# Patient Record
Sex: Female | Born: 1945 | Race: Black or African American | Hispanic: No | Marital: Married | Smoking: Never smoker
Health system: Southern US, Community
[De-identification: ages and names within clinical notes are randomized; demographics above are authoritative.]

## PROBLEM LIST (undated history)

## (undated) DIAGNOSIS — Z9289 Personal history of other medical treatment: Secondary | ICD-10-CM

## (undated) DIAGNOSIS — R413 Other amnesia: Secondary | ICD-10-CM

## (undated) DIAGNOSIS — M8580 Other specified disorders of bone density and structure, unspecified site: Secondary | ICD-10-CM

## (undated) DIAGNOSIS — I10 Essential (primary) hypertension: Secondary | ICD-10-CM

## (undated) DIAGNOSIS — R011 Cardiac murmur, unspecified: Secondary | ICD-10-CM

## (undated) DIAGNOSIS — C801 Malignant (primary) neoplasm, unspecified: Secondary | ICD-10-CM

## (undated) DIAGNOSIS — E039 Hypothyroidism, unspecified: Secondary | ICD-10-CM

## (undated) DIAGNOSIS — F0390 Unspecified dementia without behavioral disturbance: Secondary | ICD-10-CM

## (undated) HISTORY — DX: Cardiac murmur, unspecified: R01.1

## (undated) HISTORY — PX: CARPAL TUNNEL RELEASE: SHX101

## (undated) HISTORY — PX: FOOT FRACTURE SURGERY: SHX645

## (undated) HISTORY — DX: Malignant (primary) neoplasm, unspecified: C80.1

## (undated) HISTORY — DX: Other amnesia: R41.3

## (undated) HISTORY — DX: Hypothyroidism, unspecified: E03.9

## (undated) HISTORY — PX: MYOMECTOMY: SHX85

## (undated) HISTORY — DX: Other specified disorders of bone density and structure, unspecified site: M85.80

## (undated) HISTORY — DX: Personal history of other medical treatment: Z92.89

## (undated) HISTORY — DX: Unspecified dementia, unspecified severity, without behavioral disturbance, psychotic disturbance, mood disturbance, and anxiety: F03.90

## (undated) HISTORY — DX: Essential (primary) hypertension: I10

## (undated) HISTORY — PX: BUNIONECTOMY: SHX129

---

## 1998-02-06 ENCOUNTER — Encounter: Admission: RE | Admit: 1998-02-06 | Discharge: 1998-05-07 | Payer: Self-pay | Admitting: Internal Medicine

## 1998-03-10 ENCOUNTER — Encounter: Admission: RE | Admit: 1998-03-10 | Discharge: 1998-06-08 | Payer: Self-pay | Admitting: *Deleted

## 1998-10-16 DIAGNOSIS — C801 Malignant (primary) neoplasm, unspecified: Secondary | ICD-10-CM

## 1998-10-16 HISTORY — DX: Malignant (primary) neoplasm, unspecified: C80.1

## 1998-11-04 ENCOUNTER — Encounter (INDEPENDENT_AMBULATORY_CARE_PROVIDER_SITE_OTHER): Payer: Self-pay | Admitting: Specialist

## 1998-11-04 ENCOUNTER — Other Ambulatory Visit: Admission: RE | Admit: 1998-11-04 | Discharge: 1998-11-04 | Payer: Self-pay | Admitting: General Surgery

## 1998-12-09 ENCOUNTER — Ambulatory Visit (HOSPITAL_BASED_OUTPATIENT_CLINIC_OR_DEPARTMENT_OTHER): Admission: RE | Admit: 1998-12-09 | Discharge: 1998-12-09 | Payer: Self-pay | Admitting: General Surgery

## 1998-12-09 ENCOUNTER — Encounter: Payer: Self-pay | Admitting: General Surgery

## 1998-12-12 ENCOUNTER — Encounter: Admission: RE | Admit: 1998-12-12 | Discharge: 1999-03-12 | Payer: Self-pay | Admitting: *Deleted

## 1999-12-22 ENCOUNTER — Encounter: Payer: Self-pay | Admitting: Family Medicine

## 1999-12-22 ENCOUNTER — Ambulatory Visit (HOSPITAL_COMMUNITY): Admission: RE | Admit: 1999-12-22 | Discharge: 1999-12-22 | Payer: Self-pay | Admitting: Family Medicine

## 2000-03-11 ENCOUNTER — Encounter (INDEPENDENT_AMBULATORY_CARE_PROVIDER_SITE_OTHER): Payer: Self-pay

## 2000-03-11 ENCOUNTER — Ambulatory Visit (HOSPITAL_COMMUNITY): Admission: RE | Admit: 2000-03-11 | Discharge: 2000-03-11 | Payer: Self-pay | Admitting: Obstetrics and Gynecology

## 2001-11-28 ENCOUNTER — Ambulatory Visit (HOSPITAL_COMMUNITY): Admission: RE | Admit: 2001-11-28 | Discharge: 2001-11-28 | Payer: Self-pay | Admitting: Family Medicine

## 2001-11-28 ENCOUNTER — Encounter: Payer: Self-pay | Admitting: Family Medicine

## 2002-02-01 ENCOUNTER — Encounter (INDEPENDENT_AMBULATORY_CARE_PROVIDER_SITE_OTHER): Payer: Self-pay

## 2002-02-01 ENCOUNTER — Ambulatory Visit (HOSPITAL_COMMUNITY): Admission: RE | Admit: 2002-02-01 | Discharge: 2002-02-01 | Payer: Self-pay | Admitting: Obstetrics and Gynecology

## 2002-07-25 ENCOUNTER — Inpatient Hospital Stay (HOSPITAL_COMMUNITY): Admission: AC | Admit: 2002-07-25 | Discharge: 2002-07-28 | Payer: Self-pay

## 2002-07-25 ENCOUNTER — Encounter: Payer: Self-pay | Admitting: Emergency Medicine

## 2002-07-25 ENCOUNTER — Encounter: Payer: Self-pay | Admitting: Orthopedic Surgery

## 2002-09-05 ENCOUNTER — Observation Stay (HOSPITAL_COMMUNITY): Admission: RE | Admit: 2002-09-05 | Discharge: 2002-09-05 | Payer: Self-pay | Admitting: Orthopedic Surgery

## 2003-06-17 ENCOUNTER — Ambulatory Visit (HOSPITAL_COMMUNITY): Admission: RE | Admit: 2003-06-17 | Discharge: 2003-06-17 | Payer: Self-pay | Admitting: Gastroenterology

## 2004-01-06 ENCOUNTER — Other Ambulatory Visit: Admission: RE | Admit: 2004-01-06 | Discharge: 2004-01-06 | Payer: Self-pay | Admitting: Family Medicine

## 2005-01-25 ENCOUNTER — Other Ambulatory Visit: Admission: RE | Admit: 2005-01-25 | Discharge: 2005-01-25 | Payer: Self-pay | Admitting: Family Medicine

## 2005-02-03 ENCOUNTER — Encounter (HOSPITAL_COMMUNITY): Admission: RE | Admit: 2005-02-03 | Discharge: 2005-05-04 | Payer: Self-pay | Admitting: Family Medicine

## 2005-03-18 ENCOUNTER — Ambulatory Visit (HOSPITAL_COMMUNITY): Admission: RE | Admit: 2005-03-18 | Discharge: 2005-03-18 | Payer: Self-pay | Admitting: Endocrinology

## 2006-06-23 ENCOUNTER — Other Ambulatory Visit: Admission: RE | Admit: 2006-06-23 | Discharge: 2006-06-23 | Payer: Self-pay | Admitting: Family Medicine

## 2006-09-08 ENCOUNTER — Encounter: Admission: RE | Admit: 2006-09-08 | Discharge: 2006-09-08 | Payer: Self-pay | Admitting: Orthopedic Surgery

## 2008-12-16 ENCOUNTER — Other Ambulatory Visit: Admission: RE | Admit: 2008-12-16 | Discharge: 2008-12-16 | Payer: Self-pay | Admitting: Family Medicine

## 2008-12-23 ENCOUNTER — Encounter: Admission: RE | Admit: 2008-12-23 | Discharge: 2008-12-23 | Payer: Self-pay | Admitting: Family Medicine

## 2009-09-26 ENCOUNTER — Ambulatory Visit: Payer: Self-pay | Admitting: Psychology

## 2009-11-21 ENCOUNTER — Ambulatory Visit: Payer: Self-pay | Admitting: Psychology

## 2010-01-12 ENCOUNTER — Encounter: Admission: RE | Admit: 2010-01-12 | Discharge: 2010-01-12 | Payer: Self-pay | Admitting: Diagnostic Neuroimaging

## 2010-05-31 IMAGING — CR DG CHEST 2V
2 series · 2 of 2 positions shown · non-contrast
Comparison: None available

CLINICAL DATA: Weight loss, history of breast cancer

CHEST - 2 VIEW

[view not recorded (1 of 2)]
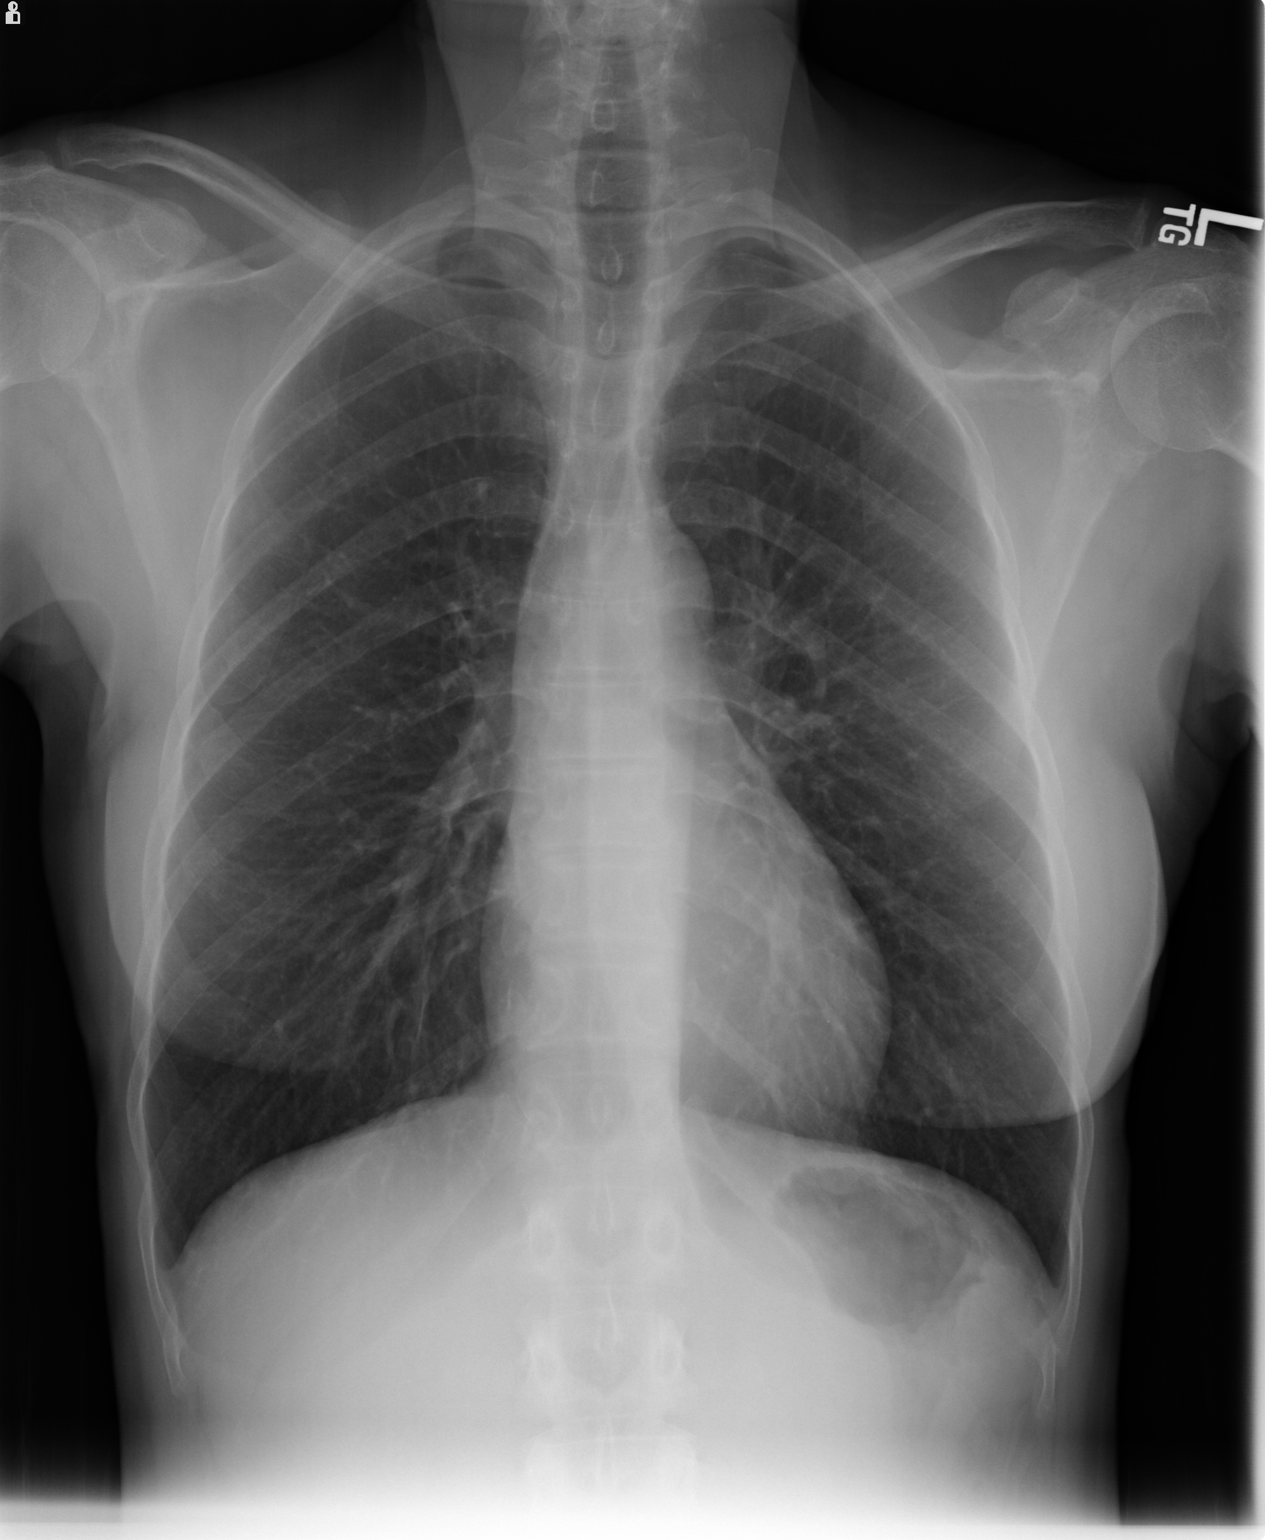

[view not recorded (2 of 2)]
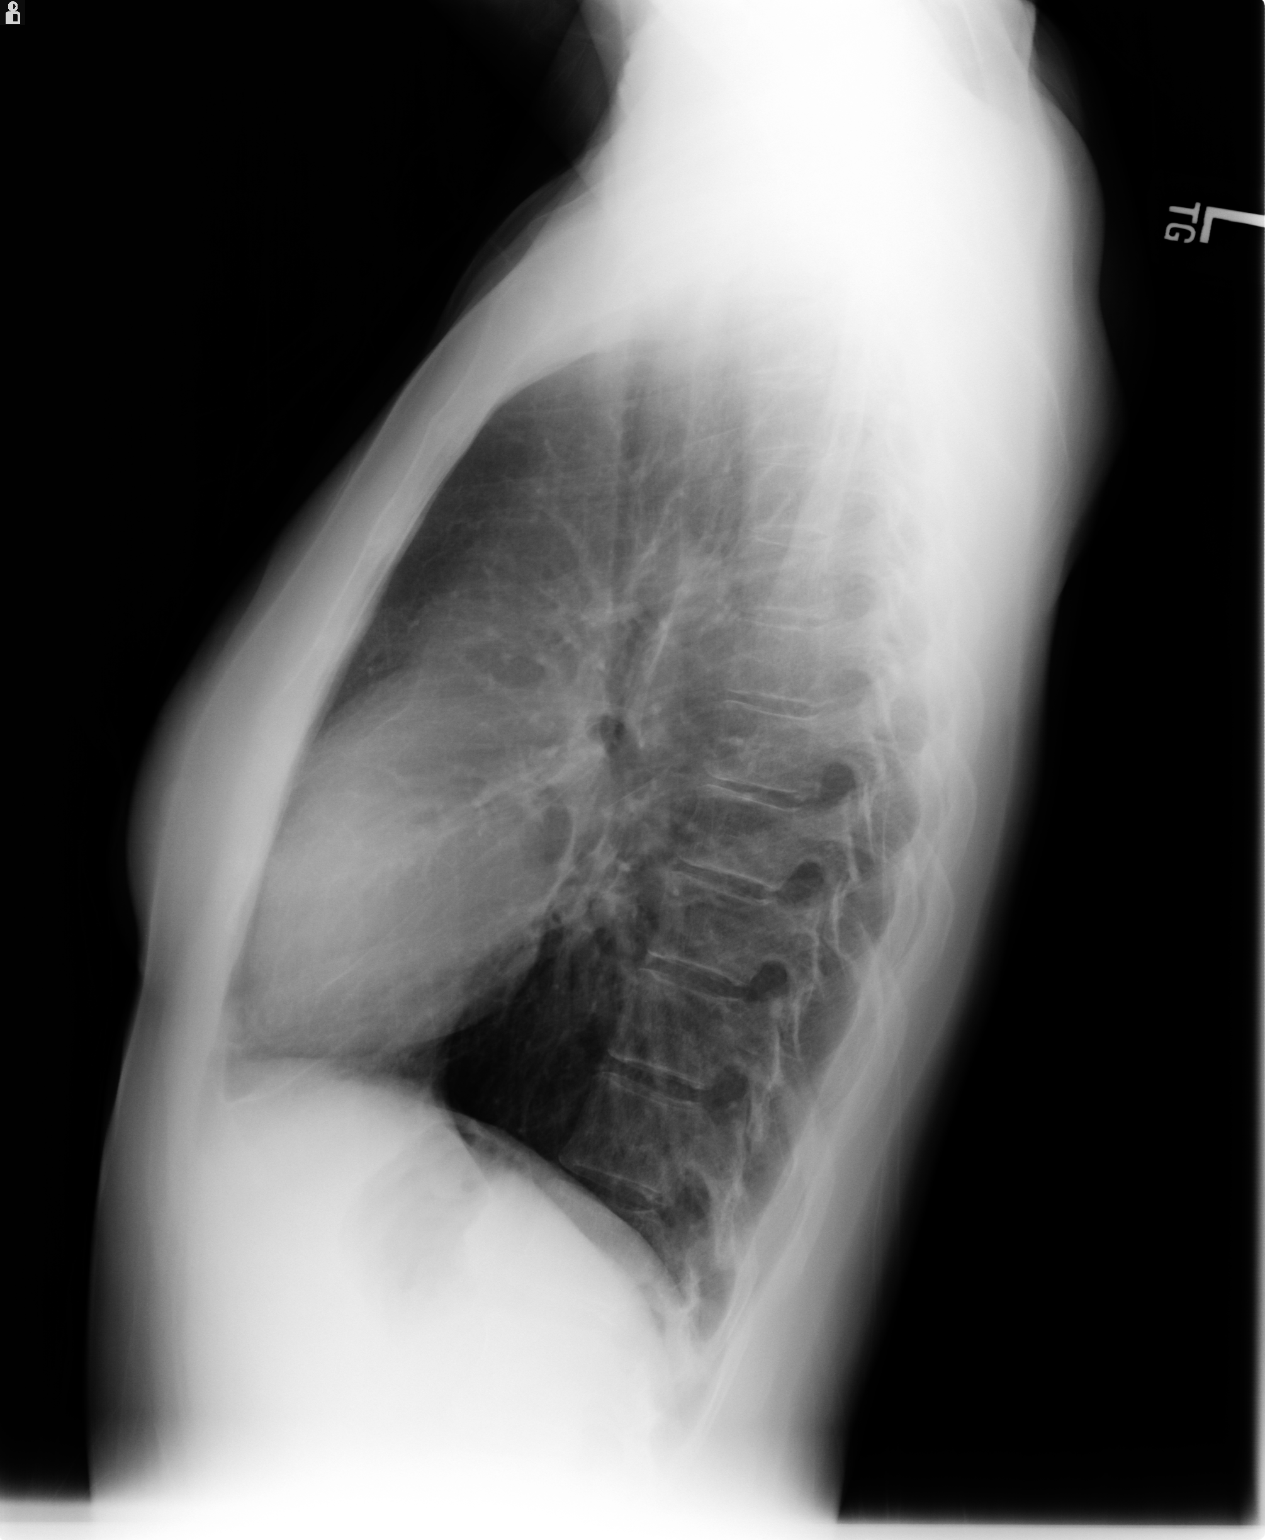

[2 of 2 positions shown; findings below may reference images not displayed]

FINDINGS: Normal mediastinum and cardiac silhouette.  Costophrenic
angles are clear.  No evidence effusion, infiltrate, or
pneumothorax. No significant bony abnormality noted.
IMPRESSION: No acute cardiopulmonary findings.
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
## 2010-10-02 NOTE — Op Note (Signed)
NAME:  Beverly Edwards, Beverly Edwards                         ACCOUNT NO.:  [id number]   MEDICAL RECORD NO.:  [id number]                   PATIENT TYPE:  INP   LOCATION:  1824                                 FACILITY:  [facility]   PHYSICIAN:  Nadara Mustard, M.D.                DATE OF BIRTH:  1945/12/03   DATE OF PROCEDURE:  07/25/2002  DATE OF DISCHARGE:                                 OPERATIVE REPORT   PREOPERATIVE DIAGNOSES:  1. Open right calcaneous fracture, type 3-B.  2. Closed right navicular fracture.  3. Closed Lisfranc fracture.   POSTOPERATIVE DIAGNOSES:  1. Open right calcaneous fracture, type 3-B.  2. Closed right navicular fracture.  3. Closed Lisfranc fracture.   PROCEDURE:  1. Irrigation and debridement with 6 liters of normal saline pulse lavage     with Dial soap.  2. Removal of ischemic bone, removal of nonviable bone and soft tissue.  3. Application of an external fixation.  4. Closure of the traumatic laceration of the skin and soft tissue, a 10-cm     laceration complicated closure.   SURGEON:  Nadara Mustard, M.D.   ANESTHESIA:  General.   ESTIMATED BLOOD LOSS:  Minimal.   ANTIBIOTICS:  1 gm of Kefzol and 60 mg of gentamicin.   DRAINS:  None.   COMPLICATIONS:  None.   TOURNIQUET TIME:  None.   DISPOSITION:  To the PACU in stable condition.   INDICATIONS FOR PROCEDURE:  The patient is a 65 year old woman with an open  calcaneous fracture, closed Lisfranc fracture and closed navicular fracture  who comes at this time for surgical intervention. The risks and benefits  were discussed. The patient states she understands the risks and wishes to  proceed at this time.   DESCRIPTION OF PROCEDURE:  The patient was brought  to operating room 1 and  underwent a general anesthetic. After an adequate level of anesthesia was  obtained the patient's right lower extremity was prepped using Betadine  paint and draped as a sterile field.   The large dramatic wound was  opened and the wound was irrigated with pulse  lavage with both normal saline and with normal saline with Dial soap. This  was cleansed. There was no gross contamination. There were very large  fragments of the calcaneous which were completely devitalized without soft  tissue attachment and these were all removed. Hemostasis was achieved. A  tourniquet was not used.   After cleansing of the wound, it appeared that the wound edges were easy to  reapproximate. The wound was closed using a far-near-near-far stitch with 2-  0 nylon and this was loosely approximated with no tension on the skin. Three  calcaneal pins were then placed through the calcaneous and this was then  constructed into a delta frame construct to pull the calcaneous out to  length. I anticipate that the patient will need bone grafting and  will need  a subtalar fusion with bone graft.   After the application of an external fixator a sterile dressing was applied  with Adaptic orthopedic sponges, 4 x 4 sponges, Kerlix and a loosely wrapped  Ace wrap. The patient's foot was viable.   The patient was extubated and taken to the PACU in stable condition. Plan  for reevaluation in 24  hours.                                               Nadara Mustard, M.D.    MVD/MEDQ  D:  07/25/2002  T:  07/25/2002  Job:  [id number]

## 2010-10-02 NOTE — H&P (Signed)
NAME:  Beverly Edwards, Beverly Edwards NO.:  [id number]   MEDICAL RECORD NO.:  [id number]                   PATIENT TYPE:  AMB   LOCATION:  SDC                                  FACILITY:  [facility]   PHYSICIAN:  Juluis Mire, M.D.                DATE OF BIRTH:  10/09/1945   DATE OF ADMISSION:  02/01/2002  DATE OF DISCHARGE:                                HISTORY & PHYSICAL   HISTORY OF PRESENT ILLNESS:  The patient is a 65 year old gravida 2, para 2  married black female who presents for a laparoscopy along with hysteroscopy  for evaluation of cystic enlargement of the ovary.   In relation to the present admission, the patient had had a previous  lumpectomy for breast carcinoma.  She has been on Tamoxifen.  Because of  being on Tamoxifen she had a screening ultrasound ordered by her family  doctor.  This did reveal a 2 cm cystic enlargement of the right ovary.  There was an apparent soft tissue mass that was felt to possibly represent a  blood clot.  She was referred back to our office for evaluation.  On  ultrasound evaluation she continued cystic enlargement of the right adnexa  measuring approximately 2 cm and did have an internal solid component.  She  was also noted to have multiple fibroids, all of which have been stable on  follow-up.  In view of the continued cystic enlargement of the right adnexa  and a history of breast cancer, the patient has now been admitted for  laparoscopic removal of right tube and ovary.  Because of being on Tamoxifen  and its potential endometrial changes, we are going to proceed with  hysteroscopy at the same time.   ALLERGIES:  CODEINE, ASPIRIN, AMOXICILLIN.   MEDICATIONS:  Tamoxifen and calcium.   PAST MEDICAL HISTORY:  Significant for history of breast carcinoma for which  she is on Tamoxifen.  Otherwise, usual childhood disease without sequelae.   PAST SURGICAL HISTORY:  She has had bunionectomy, release of right carpal  tunnel, surgery on the left hand, as well as the previously noted  lumpectomy.  She also had a hysteroscopy done in 2001.   OBSTETRICAL HISTORY:  Two vaginal deliveries.   FAMILY HISTORY:  Noncontributory.   SOCIAL HISTORY:  No tobacco or alcohol use.   REVIEW OF SYMPTOMS:  Noncontributory.   PHYSICAL EXAMINATION:  VITAL SIGNS:  The patient is afebrile with stable  vital signs.  HEENT:  The patient is normocephalic.  Pupils are equal, round, and reactive  to light and accommodation.  Extraocular movements are intact.  Sclerae and  conjunctivae clear.  Oropharynx clear.  NECK:  Without thyromegaly.  BREASTS:  Not examined.  LUNGS:  Clear.  CARDIOVASCULAR:  Regular rate and rhythm.  No murmurs or gallops.  ABDOMEN:  Benign.  PELVIC:  Normal external genitalia.  Vaginal mucosa clear.  Cervix  unremarkable.  Uterus is upper limits of normal size, slightly irregular,  consistent with known uterine fibroids.  Adnexa unremarkable.  EXTREMITIES:  Trace edema.  NEUROLOGIC:  Grossly within normal limits.   IMPRESSION:  1. Persistent cystic enlargement of the right ovary with solid component.  2. History of breast cancer presently on Tamoxifen.   PLAN:  The patient will undergo the above noted surgery.  The risks of  surgery have been discussed including the risk of infection, the risk of  hemorrhage that could necessitate transfusion with the risk of AIDS or  hepatitis, the risk of injury to adjacent organs including bladder, bowel,  ureters that could require further exploratory surgery, the risk of deep  venous thrombosis and pulmonary embolus.  The patient expressed  understanding of indications and risks.                                               Juluis Mire, M.D.    JSM/MEDQ  D:  02/01/2002  T:  02/01/2002  Job:  [id number]

## 2010-10-02 NOTE — Op Note (Signed)
[facility]  Patient:    Beverly Edwards, Beverly Edwards                      MRN: [id number] Proc. Date: 03/11/00 Adm. Date:  [id number] Attending:  Frederich Balding                           Operative Report  PREOPERATIVE DIAGNOSIS:       Postmenopausal bleeding on Tamoxifen, with apparent endometrial polyp.  POSTOPERATIVE DIAGNOSIS:      Postmenopausal bleeding on Tamoxifen, with apparent endometrial polyp.  OPERATIVE PROCEDURE:          Hysteroscopy with resection of endometrial polyp.  Numerous endometrial biopsies and endometrial curettings.  ANESTHESIA:                   Sedation with paracervical block.  SURGEON:                      Juluis Mire, M.D.  ESTIMATED BLOOD LOSS:         Minimal.  PACKS/DRAINS:                 None.  Intraoperative Foley catheter placement.  COMPLICATIONS:                None.  INDICATIONS:                  See dictated history and physical.  DESCRIPTION OF PROCEDURE:     The patient was taken to the OR and placed in the supine position.  After sedation she was placed in the dorsolithotomy position.  The patient was then draped out for hysteroscopy.  Speculum was placed in the vaginal vault.  The cervix and vagina were cleansed with Betadine.  A paracervical block was instituted using 1% Xylocaine.  The anterior lip of the cervix was grasped with a single-tooth tenaculum.  The uterus was sounded to approximately 8-9 cm.  The cervix was thoroughly dilated to a size 33 Pratt dilator.  The operative hysteroscope was then introduced. The intrauterine cavity was distended using Sorbitol.  In the right fundal area was an apparent endometrial polyp.  This was resected and sent for pathologic review.  We then did multiple endometrial biopsies using resectoscope, both on the anterior, posterior and lateral walls; these were sent for pathological review.  We had good hemostasis and no evidence of perforation.  Endometrial  curettings were also obtained and sent for pathological review.  At this point and time the resectoscope, single-tooth tenaculum and speculum had been removed.  The patient was taken out of the dorsolithotomy position. Once alert and extubated transferred to recovery room in good condition. Sponge, needle and instrument counts were reported correct by circulating nurse. DD:  03/11/00 TD:  03/11/00 Job: [id number]

## 2010-10-02 NOTE — H&P (Signed)
[facility]  Patient:    Beverly Edwards, Beverly Edwards                        MRN: [id number] Adm. Date:  03/11/00 Attending:  Juluis Mire, M.D.                         History and Physical  CHIEF COMPLAINT:              The patient is a 65 year old gravida 2 para 2 postmenopausal white female, who presents for hysteroscopy and dilatation and curettage for evaluation of abnormal uterine bleeding.  HISTORY OF PRESENT ILLNESS:   The patient has a history of a previous lumpectomy for breast carcinoma and has presently been on Tamoxifen.  She had onset of vaginal bleeding and was referred to our practice for evaluation. Ultrasound revealed a 7.6 mm endometrial stripe with 2.3 cm submucosal fibroid.  Subsequent saline infusion ultrasound did reveal a fundal polyp with increase in endometrial thickness.  Because she was on Tamoxifen she presents now for hysteroscopic evaluation with dilatation and curettage.  ALLERGIES:                    1. CODEINE.                               2. ASPIRIN.                               3. AMOXICILLIN.  MEDICATIONS:                  1. Tamoxifen.                               2. Calcium.  PAST MEDICAL HISTORY:         History of previous lumpectomy for breast carcinoma.  As noted, she is on Tamoxifen.  Otherwise, she has had the usual childhood diseases.  PAST SURGICAL HISTORY:        1. Bilateral bunionectomy.                               2. Carpel tunnel release of right wrist.                               3. Surgery of left hand.  PAST OBSTETRICAL HISTORY:     Two vaginal deliveries.  FAMILY HISTORY:               Noncontributory.  SOCIAL HISTORY:               No tobacco or alcohol use.  REVIEW OF SYSTEMS:            Noncontributory.  PHYSICAL EXAMINATION:  VITAL SIGNS:                  The patient is afebrile and has stable vital signs.  HEENT:                        Head normocephalic.  PERRLA.  EOMI.  Sclerae  and conjunctivae clear.  Oropharynx clear.  NECK:  Without thyromegaly.  BREAST:                       Not examined.  LUNGS:                        Clear.  CARDIAC:                      Regular rate and rhythm.  No murmurs, rubs, or gallops.  ABDOMEN:                      Benign examination.  PELVIC:                       Normal external genitalia.  Vaginal mucosa is atrophic.  Cervix unremarkable.  Uterus upper limits of normal size.  Adnexa unremarkable.  EXTREMITIES:                  Trace edema noted.  NEUROLOGIC:                   Grossly within normal limits.  IMPRESSION:                   Abnormal uterine bleeding in a postmenopausal white female, on Tamoxifen.  Rule out endometrial pathology.  PLAN:                         The patient will undergo hysteroscopy and dilatation and curettage.  The risks have been discussed including anesthetic concerns, risk of infection, risk of hemorrhage that could necessitate transfusion or possible hysterectomy, risk of uterine perforation that could lead to injury to adjacent organs requiring subsequent diagnostic laparoscopy and exploratory laparotomy for management, risk of fluid overload causing pulmonary edema or hyponatremia.  The patient expressed understanding of the indications and risks. DD:  03/11/00 TD:  03/11/00 Job: [id number]

## 2010-10-02 NOTE — Op Note (Signed)
NAME:  Beverly Edwards, Beverly Edwards                         ACCOUNT NO.:  [id number]   MEDICAL RECORD NO.:  [id number]                   PATIENT TYPE:  AMB   LOCATION:  ENDO                                 FACILITY:  [facility]   PHYSICIAN:  Graylin Shiver, M.D.                DATE OF BIRTH:  Jan 21, 1946   DATE OF PROCEDURE:  06/17/2003  DATE OF DISCHARGE:                                 OPERATIVE REPORT   PROCEDURE:  Colonoscopy.   INDICATIONS:  Screening.   Informed consent was procured from the patient after explanation of the  risks of bleeding, infection, and perforation.   PREMEDICATION:  Fentanyl 75 mcg IV, Versed 8 mg IV.   DESCRIPTION OF PROCEDURE:  With the patient in the left lateral decubitus  position, a rectal exam was performed.  No masses were felt.  The Olympus  colonoscope was inserted into the rectum, advanced around the colon to the  cecum, cecal landmarks were identified.  The cecum and ascending colon were  normal, the transverse colon normal, the descending colon, sigmoid, and  rectum were normal.  She tolerated the procedure well without complications.   IMPRESSION:  Normal colonoscopy to the cecum.   I would recommend a follow-up screening colonoscopy again in 10 years.                                               Graylin Shiver, M.D.    [name]  D:  06/17/2003  T:  06/17/2003  Job:  [id number]   cc:   Stacie Acres. White, M.D.  [address]  [place]  [place] [address]  Fax: [phone]
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
## 2010-10-02 NOTE — Op Note (Signed)
NAME:  Beverly Edwards, Beverly Edwards                         ACCOUNT NO.:  [id number]   MEDICAL RECORD NO.:  [id number]                   PATIENT TYPE:  AMB   LOCATION:  SDC                                  FACILITY:  [facility]   PHYSICIAN:  Juluis Mire, M.D.                DATE OF BIRTH:  May 05, 1946   DATE OF PROCEDURE:  02/01/2002  DATE OF DISCHARGE:                                 OPERATIVE REPORT   PREOPERATIVE DIAGNOSES:  1. Cystic enlargement of the right ovary.  2. Right ovarian neoplasm.  3. Tamoxifen use.   POSTOPERATIVE DIAGNOSES:  1. Cystic enlargement of the right ovary.  2. Right ovarian neoplasm.  3. Tamoxifen use.  4. Right-sided endometrioma.  5. Extensive pelvic adhesions.   PROCEDURES:  1. Hysteroscopy with multiple endometrial biopsies and intrauterine     curettings.  2. Open laparoscopy with lysis of adhesions.  3. Right salpingo-oophorectomy.   SURGEON:  Juluis Mire, M.D.   ANESTHESIA:  General endotracheal.   ESTIMATED BLOOD LOSS:  Minimal.   PACKS AND DRAINS:  None.   INTRAOPERATIVE BLOOD REPLACED:  None.   COMPLICATIONS:  None.   INDICATIONS FOR PROCEDURE:  Noted in history and physical.   PROCEDURE AS FOLLOWS:  The patient taken to the OR, placed in supine  position.  After satisfactory level of  general endotracheal anesthesia was  obtained, the patient was placed in the dorsal lithotomy position using the  Allen stirrups.  The abdomen, perineum, and vagina were prepped out with  Betadine.  The patient was then draped out for hysteroscopy.  Speculum was  placed in the vaginal vault.  The cervix was grasped with a single-tooth  tenaculum.  The uterus sounded to approximately 10 cm.  Cervix serially  dilated to a size 33 Pratt dilator.  The operative hysteroscope was  introduced.  The intrauterine cavity was distended using sorbitol.  There  were no polyps or intrauterine processes noted.  Multiple endometrial  biopsies were obtained and sent for  pathological review.  Endometrial  curettings were also obtained.  Minimal tissue was noted on the curettings.  The Hulka tenaculum was then put in place.  The single-tooth tenaculum and  speculum then removed.   The patient's legs were repositioned.  A subumbilical incision was made with  a knife.  The incision was then extended through subcutaneous tissue.  The  fascia was entered sharply, and the incision extended laterally.  The  peritoneum was entered sharply.  There was no palpable adhesions around the  umbilicus.  Two lateral sutures of 0 Vicryl were put into the fascia and  held.  The Hasson cannula was put in place and secured with the sutures.  The abdomen was insufflated with carbon dioxide.  The laparoscope was  introduced.  A 5-mm trocar was placed in the suprapubic area as well as the  right lower quadrant after visualization of the  epigastric vessels.  It was  of note that the bladder had been emptied by in-and-out catheterization  prior to the laparoscopy.  At this point in time, we elevated the uterus.  There were thin adhesions between the posterior aspect of the uterus and the  cul-de-sac.  The left tube and ovary were unremarkable except for a simple  cyst.  The right ovary appeared to be stuck to the right pelvic side wall.  Using the bipolar and scissors, the adhesions were freed up.  Of note we  were able to elevated the uterus.  The right ovary had an apparent  endometrioma.  We were able to dissect it free from the right pelvic side  wall.  During this time, the endometrioma was entered.  A large amount of  old blood was extruded.  This was fully irrigated out.  We were then able to  elevate the ovary up off the pelvic side wall.  The decision was to remove  the ovary at this point in time.  We identified the ureter and the uterine  vessels.  Using the plasma kinetic tripolar, we cauterized and incised the  ovarian vasculature as well as the utero-ovarian pedicle.   The ovary was  then removed through the subumbilical port.  We went back and observed the  area.  Bleeding in the area was brought under control with the bipolar.  One  small amount of the ovary was left attached to the right pelvic side wall  where it appeared to be adherent over the ureter.  We had good hemostasis.  The ureter appeared to be free from the surgical area by visualization.  We  thoroughly irrigated the pelvis.  We had good hemostasis.  All adhesions  were freed up.  The appendix was seen to be normal.  The upper abdomen  including the liver and tip of the gallbladder were clear.  There was no  injury to those organs.  At this point in time, the abdomen was deflated of  its carbon dioxide.  All trocars removed.  The subumbilical fascia was  closed with figure-of-eight of 0 Vicryl.  Skin was closed with running  subcuticular of 3-0 Vicryl.  The suprapubic incision was closed with Steri-  Strips.  The Hulka tenaculum was then removed.  The patient taken out of the  dorsal lithotomy position.  Once alert and extubated, transferred to the  recovery room in good condition.  Sponge, instrument, and needle counts  reported as correct per circulating nurse x2.                                                Juluis Mire, M.D.    JSM/MEDQ  D:  02/01/2002  T:  02/01/2002  Job:  [id number]
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
## 2011-06-14 DIAGNOSIS — R634 Abnormal weight loss: Secondary | ICD-10-CM | POA: Diagnosis not present

## 2011-06-14 DIAGNOSIS — Z23 Encounter for immunization: Secondary | ICD-10-CM | POA: Diagnosis not present

## 2011-06-14 DIAGNOSIS — Z1331 Encounter for screening for depression: Secondary | ICD-10-CM | POA: Diagnosis not present

## 2011-06-14 DIAGNOSIS — M25579 Pain in unspecified ankle and joints of unspecified foot: Secondary | ICD-10-CM | POA: Diagnosis not present

## 2011-06-14 DIAGNOSIS — E039 Hypothyroidism, unspecified: Secondary | ICD-10-CM | POA: Diagnosis not present

## 2011-06-14 DIAGNOSIS — R413 Other amnesia: Secondary | ICD-10-CM | POA: Diagnosis not present

## 2011-06-14 DIAGNOSIS — I10 Essential (primary) hypertension: Secondary | ICD-10-CM | POA: Diagnosis not present

## 2011-07-07 DIAGNOSIS — R413 Other amnesia: Secondary | ICD-10-CM | POA: Diagnosis not present

## 2011-09-09 DIAGNOSIS — M25579 Pain in unspecified ankle and joints of unspecified foot: Secondary | ICD-10-CM | POA: Diagnosis not present

## 2011-09-09 DIAGNOSIS — F0390 Unspecified dementia without behavioral disturbance: Secondary | ICD-10-CM | POA: Diagnosis not present

## 2011-09-09 DIAGNOSIS — R634 Abnormal weight loss: Secondary | ICD-10-CM | POA: Diagnosis not present

## 2011-11-25 DIAGNOSIS — N644 Mastodynia: Secondary | ICD-10-CM | POA: Diagnosis not present

## 2011-11-25 DIAGNOSIS — Z853 Personal history of malignant neoplasm of breast: Secondary | ICD-10-CM | POA: Diagnosis not present

## 2011-12-09 DIAGNOSIS — E039 Hypothyroidism, unspecified: Secondary | ICD-10-CM | POA: Diagnosis not present

## 2011-12-09 DIAGNOSIS — I10 Essential (primary) hypertension: Secondary | ICD-10-CM | POA: Diagnosis not present

## 2011-12-09 DIAGNOSIS — M25579 Pain in unspecified ankle and joints of unspecified foot: Secondary | ICD-10-CM | POA: Diagnosis not present

## 2012-01-25 DIAGNOSIS — Z23 Encounter for immunization: Secondary | ICD-10-CM | POA: Diagnosis not present

## 2012-01-25 DIAGNOSIS — E039 Hypothyroidism, unspecified: Secondary | ICD-10-CM | POA: Diagnosis not present

## 2012-03-09 DIAGNOSIS — E039 Hypothyroidism, unspecified: Secondary | ICD-10-CM | POA: Diagnosis not present

## 2012-06-08 DIAGNOSIS — F0390 Unspecified dementia without behavioral disturbance: Secondary | ICD-10-CM | POA: Diagnosis not present

## 2012-06-08 DIAGNOSIS — I10 Essential (primary) hypertension: Secondary | ICD-10-CM | POA: Diagnosis not present

## 2012-06-08 DIAGNOSIS — E039 Hypothyroidism, unspecified: Secondary | ICD-10-CM | POA: Diagnosis not present

## 2012-06-08 DIAGNOSIS — M25579 Pain in unspecified ankle and joints of unspecified foot: Secondary | ICD-10-CM | POA: Diagnosis not present

## 2012-08-07 DIAGNOSIS — I10 Essential (primary) hypertension: Secondary | ICD-10-CM | POA: Diagnosis not present

## 2012-08-07 DIAGNOSIS — H6120 Impacted cerumen, unspecified ear: Secondary | ICD-10-CM | POA: Diagnosis not present

## 2012-08-07 DIAGNOSIS — R413 Other amnesia: Secondary | ICD-10-CM | POA: Diagnosis not present

## 2012-09-04 DIAGNOSIS — H6120 Impacted cerumen, unspecified ear: Secondary | ICD-10-CM | POA: Diagnosis not present

## 2012-09-04 DIAGNOSIS — M7710 Lateral epicondylitis, unspecified elbow: Secondary | ICD-10-CM | POA: Diagnosis not present

## 2012-12-04 DIAGNOSIS — R413 Other amnesia: Secondary | ICD-10-CM | POA: Diagnosis not present

## 2012-12-04 DIAGNOSIS — Z1331 Encounter for screening for depression: Secondary | ICD-10-CM | POA: Diagnosis not present

## 2012-12-04 DIAGNOSIS — E039 Hypothyroidism, unspecified: Secondary | ICD-10-CM | POA: Diagnosis not present

## 2012-12-04 DIAGNOSIS — I10 Essential (primary) hypertension: Secondary | ICD-10-CM | POA: Diagnosis not present

## 2012-12-04 DIAGNOSIS — E559 Vitamin D deficiency, unspecified: Secondary | ICD-10-CM | POA: Diagnosis not present

## 2013-01-16 DIAGNOSIS — Z23 Encounter for immunization: Secondary | ICD-10-CM | POA: Diagnosis not present

## 2013-01-16 DIAGNOSIS — E039 Hypothyroidism, unspecified: Secondary | ICD-10-CM | POA: Diagnosis not present

## 2013-04-05 DIAGNOSIS — M25579 Pain in unspecified ankle and joints of unspecified foot: Secondary | ICD-10-CM | POA: Diagnosis not present

## 2013-04-05 DIAGNOSIS — B351 Tinea unguium: Secondary | ICD-10-CM | POA: Diagnosis not present

## 2013-04-06 DIAGNOSIS — E559 Vitamin D deficiency, unspecified: Secondary | ICD-10-CM | POA: Diagnosis not present

## 2013-04-06 DIAGNOSIS — I10 Essential (primary) hypertension: Secondary | ICD-10-CM | POA: Diagnosis not present

## 2013-04-06 DIAGNOSIS — E039 Hypothyroidism, unspecified: Secondary | ICD-10-CM | POA: Diagnosis not present

## 2013-04-06 DIAGNOSIS — R413 Other amnesia: Secondary | ICD-10-CM | POA: Diagnosis not present

## 2013-05-23 DIAGNOSIS — E039 Hypothyroidism, unspecified: Secondary | ICD-10-CM | POA: Diagnosis not present

## 2013-07-05 DIAGNOSIS — E039 Hypothyroidism, unspecified: Secondary | ICD-10-CM | POA: Diagnosis not present

## 2013-08-20 DIAGNOSIS — L97409 Non-pressure chronic ulcer of unspecified heel and midfoot with unspecified severity: Secondary | ICD-10-CM | POA: Diagnosis not present

## 2013-08-20 DIAGNOSIS — B351 Tinea unguium: Secondary | ICD-10-CM | POA: Diagnosis not present

## 2013-10-05 DIAGNOSIS — E039 Hypothyroidism, unspecified: Secondary | ICD-10-CM | POA: Diagnosis not present

## 2013-10-05 DIAGNOSIS — Z23 Encounter for immunization: Secondary | ICD-10-CM | POA: Diagnosis not present

## 2013-10-05 DIAGNOSIS — F0390 Unspecified dementia without behavioral disturbance: Secondary | ICD-10-CM | POA: Diagnosis not present

## 2013-10-05 DIAGNOSIS — I10 Essential (primary) hypertension: Secondary | ICD-10-CM | POA: Diagnosis not present

## 2013-11-19 DIAGNOSIS — L97409 Non-pressure chronic ulcer of unspecified heel and midfoot with unspecified severity: Secondary | ICD-10-CM | POA: Diagnosis not present

## 2014-01-07 DIAGNOSIS — E559 Vitamin D deficiency, unspecified: Secondary | ICD-10-CM | POA: Diagnosis not present

## 2014-01-07 DIAGNOSIS — F0390 Unspecified dementia without behavioral disturbance: Secondary | ICD-10-CM | POA: Diagnosis not present

## 2014-01-07 DIAGNOSIS — Z23 Encounter for immunization: Secondary | ICD-10-CM | POA: Diagnosis not present

## 2014-01-07 DIAGNOSIS — E039 Hypothyroidism, unspecified: Secondary | ICD-10-CM | POA: Diagnosis not present

## 2014-01-07 DIAGNOSIS — I10 Essential (primary) hypertension: Secondary | ICD-10-CM | POA: Diagnosis not present

## 2014-02-18 DIAGNOSIS — L97421 Non-pressure chronic ulcer of left heel and midfoot limited to breakdown of skin: Secondary | ICD-10-CM | POA: Diagnosis not present

## 2014-02-18 DIAGNOSIS — B351 Tinea unguium: Secondary | ICD-10-CM | POA: Diagnosis not present

## 2014-04-09 DIAGNOSIS — E559 Vitamin D deficiency, unspecified: Secondary | ICD-10-CM | POA: Diagnosis not present

## 2014-04-09 DIAGNOSIS — M8580 Other specified disorders of bone density and structure, unspecified site: Secondary | ICD-10-CM | POA: Diagnosis not present

## 2014-04-09 DIAGNOSIS — E039 Hypothyroidism, unspecified: Secondary | ICD-10-CM | POA: Diagnosis not present

## 2014-04-09 DIAGNOSIS — F0390 Unspecified dementia without behavioral disturbance: Secondary | ICD-10-CM | POA: Diagnosis not present

## 2014-04-09 DIAGNOSIS — M19171 Post-traumatic osteoarthritis, right ankle and foot: Secondary | ICD-10-CM | POA: Diagnosis not present

## 2014-04-09 DIAGNOSIS — I10 Essential (primary) hypertension: Secondary | ICD-10-CM | POA: Diagnosis not present

## 2014-05-20 DIAGNOSIS — M79671 Pain in right foot: Secondary | ICD-10-CM | POA: Diagnosis not present

## 2014-05-20 DIAGNOSIS — M79672 Pain in left foot: Secondary | ICD-10-CM | POA: Diagnosis not present

## 2014-05-20 DIAGNOSIS — B351 Tinea unguium: Secondary | ICD-10-CM | POA: Diagnosis not present

## 2014-07-03 DIAGNOSIS — F0390 Unspecified dementia without behavioral disturbance: Secondary | ICD-10-CM | POA: Diagnosis not present

## 2014-07-03 DIAGNOSIS — I10 Essential (primary) hypertension: Secondary | ICD-10-CM | POA: Diagnosis not present

## 2014-08-22 DIAGNOSIS — M79671 Pain in right foot: Secondary | ICD-10-CM | POA: Diagnosis not present

## 2014-08-22 DIAGNOSIS — B351 Tinea unguium: Secondary | ICD-10-CM | POA: Diagnosis not present

## 2014-08-22 DIAGNOSIS — M79672 Pain in left foot: Secondary | ICD-10-CM | POA: Diagnosis not present

## 2014-10-04 DIAGNOSIS — Z1231 Encounter for screening mammogram for malignant neoplasm of breast: Secondary | ICD-10-CM | POA: Diagnosis not present

## 2014-10-08 DIAGNOSIS — E559 Vitamin D deficiency, unspecified: Secondary | ICD-10-CM | POA: Diagnosis not present

## 2014-10-08 DIAGNOSIS — I10 Essential (primary) hypertension: Secondary | ICD-10-CM | POA: Diagnosis not present

## 2014-10-08 DIAGNOSIS — F0390 Unspecified dementia without behavioral disturbance: Secondary | ICD-10-CM | POA: Diagnosis not present

## 2014-10-08 DIAGNOSIS — E039 Hypothyroidism, unspecified: Secondary | ICD-10-CM | POA: Diagnosis not present

## 2014-10-25 DIAGNOSIS — R928 Other abnormal and inconclusive findings on diagnostic imaging of breast: Secondary | ICD-10-CM | POA: Diagnosis not present

## 2014-11-21 DIAGNOSIS — M79671 Pain in right foot: Secondary | ICD-10-CM | POA: Diagnosis not present

## 2014-11-21 DIAGNOSIS — B351 Tinea unguium: Secondary | ICD-10-CM | POA: Diagnosis not present

## 2014-11-21 DIAGNOSIS — M79672 Pain in left foot: Secondary | ICD-10-CM | POA: Diagnosis not present

## 2015-02-24 DIAGNOSIS — M79672 Pain in left foot: Secondary | ICD-10-CM | POA: Diagnosis not present

## 2015-02-24 DIAGNOSIS — M79671 Pain in right foot: Secondary | ICD-10-CM | POA: Diagnosis not present

## 2015-04-15 DIAGNOSIS — F0390 Unspecified dementia without behavioral disturbance: Secondary | ICD-10-CM | POA: Diagnosis not present

## 2015-04-15 DIAGNOSIS — M19171 Post-traumatic osteoarthritis, right ankle and foot: Secondary | ICD-10-CM | POA: Diagnosis not present

## 2015-04-15 DIAGNOSIS — I10 Essential (primary) hypertension: Secondary | ICD-10-CM | POA: Diagnosis not present

## 2015-04-15 DIAGNOSIS — E039 Hypothyroidism, unspecified: Secondary | ICD-10-CM | POA: Diagnosis not present

## 2015-04-15 DIAGNOSIS — Z23 Encounter for immunization: Secondary | ICD-10-CM | POA: Diagnosis not present

## 2015-06-09 DIAGNOSIS — M79672 Pain in left foot: Secondary | ICD-10-CM | POA: Diagnosis not present

## 2015-06-09 DIAGNOSIS — M79671 Pain in right foot: Secondary | ICD-10-CM | POA: Diagnosis not present

## 2015-06-09 DIAGNOSIS — B351 Tinea unguium: Secondary | ICD-10-CM | POA: Diagnosis not present

## 2015-09-08 DIAGNOSIS — B351 Tinea unguium: Secondary | ICD-10-CM | POA: Diagnosis not present

## 2015-09-08 DIAGNOSIS — M79672 Pain in left foot: Secondary | ICD-10-CM | POA: Diagnosis not present

## 2015-09-08 DIAGNOSIS — M79671 Pain in right foot: Secondary | ICD-10-CM | POA: Diagnosis not present

## 2015-10-14 DIAGNOSIS — E039 Hypothyroidism, unspecified: Secondary | ICD-10-CM | POA: Diagnosis not present

## 2015-10-14 DIAGNOSIS — I10 Essential (primary) hypertension: Secondary | ICD-10-CM | POA: Diagnosis not present

## 2015-10-14 DIAGNOSIS — F0390 Unspecified dementia without behavioral disturbance: Secondary | ICD-10-CM | POA: Diagnosis not present

## 2015-10-29 ENCOUNTER — Ambulatory Visit (INDEPENDENT_AMBULATORY_CARE_PROVIDER_SITE_OTHER): Payer: Medicare Other | Admitting: Diagnostic Neuroimaging

## 2015-10-29 ENCOUNTER — Encounter: Payer: Self-pay | Admitting: Diagnostic Neuroimaging

## 2015-10-29 DIAGNOSIS — F0390 Unspecified dementia without behavioral disturbance: Secondary | ICD-10-CM

## 2015-10-29 DIAGNOSIS — F03C0 Unspecified dementia, severe, without behavioral disturbance, psychotic disturbance, mood disturbance, and anxiety: Secondary | ICD-10-CM
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
## 2015-12-08 DIAGNOSIS — M79672 Pain in left foot: Secondary | ICD-10-CM | POA: Diagnosis not present

## 2015-12-08 DIAGNOSIS — M79671 Pain in right foot: Secondary | ICD-10-CM | POA: Diagnosis not present

## 2015-12-08 DIAGNOSIS — B351 Tinea unguium: Secondary | ICD-10-CM | POA: Diagnosis not present

## 2016-03-19 ENCOUNTER — Encounter (INDEPENDENT_AMBULATORY_CARE_PROVIDER_SITE_OTHER): Payer: Self-pay | Admitting: Orthopedic Surgery

## 2016-03-19 ENCOUNTER — Ambulatory Visit (INDEPENDENT_AMBULATORY_CARE_PROVIDER_SITE_OTHER): Payer: Medicare Other | Admitting: Orthopedic Surgery

## 2016-03-19 VITALS — Ht 64.0 in | Wt 114.0 lb

## 2016-03-19 DIAGNOSIS — B351 Tinea unguium: Secondary | ICD-10-CM | POA: Diagnosis not present

## 2016-03-19 DIAGNOSIS — F0281 Dementia in other diseases classified elsewhere with behavioral disturbance: Secondary | ICD-10-CM | POA: Diagnosis not present

## 2016-03-19 DIAGNOSIS — F02818 Dementia in other diseases classified elsewhere, unspecified severity, with other behavioral disturbance: Secondary | ICD-10-CM

## 2016-03-19 DIAGNOSIS — G301 Alzheimer's disease with late onset: Secondary | ICD-10-CM

## 2016-03-19 NOTE — Progress Notes (Signed)
   Office Visit Note   Patient: Beverly Edwards           Date of Birth: Nov 30, 1945           MRN: [id number] Visit Date: 03/19/2016              Requested by: No referring provider defined for this encounter. PCP: Vidal Schwalbe, MD   Assessment & Plan: Visit Diagnoses:  1. Onychomycosis   2. Late onset Alzheimer's disease with behavioral disturbance     Plan: Nails were trimmed 10 without complications. Patient has extremely combative and extremely difficult to trim the nails.  Follow-Up Instructions: Return in about 3 months (around 06/19/2016).   Orders:  No orders of the defined types were placed in this encounter.  No orders of the defined types were placed in this encounter.     Procedures: No procedures performed   Clinical Data: No additional findings.   Subjective: Chief Complaint  Patient presents with  . Right Foot - Nail Problem  . Left Foot - Nail Problem    Patient is here for bilateral lower extremity nail trim.    Review of Systems   Objective: Vital Signs: Ht 5\' 4"  (1.626 m)   Wt 114 lb (51.7 kg)   BMI 19.57 kg/m   Physical Exam on examination patient is not alert not oriented she has difficulty with ambulation. Examination of both lower extremities she has no plantar ulcers no venous ulcers she has thick and discolored onychomycotic nails 10 patient is unable to safely trim the nails on her own nails are trimmed 10 without complications. Patient was extremely combative and was extremely difficult to trim her nails. Nails were trimmed safely.  Ortho Exam  Specialty Comments:  No specialty comments available.  Imaging: No results found.   PMFS History: There are no active problems to display for this patient.  Past Medical History:  Diagnosis Date  . Cancer ([place]) 10/1998   r breast  . Dementia   . Heart murmur   . History of positive PPD   . Hypertension   . Hypothyroid    s/p radioactive iodine  . Memory loss   .  Osteopenia     Family History  Problem Relation Age of Onset  . Cancer Mother     Past Surgical History:  Procedure Laterality Date  . BUNIONECTOMY    . CARPAL TUNNEL RELEASE Right   . FOOT FRACTURE SURGERY    . MYOMECTOMY     Social History   Occupational History  .      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 Smoking status: Never Smoker  . Smokeless tobacco: Never Used  . Alcohol use No     Comment: quit 2004  . Drug use: No  . Sexual activity: Not on file

## 2016-04-14 DIAGNOSIS — E039 Hypothyroidism, unspecified: Secondary | ICD-10-CM | POA: Diagnosis not present

## 2016-04-14 DIAGNOSIS — F0390 Unspecified dementia without behavioral disturbance: Secondary | ICD-10-CM | POA: Diagnosis not present

## 2016-04-14 DIAGNOSIS — I10 Essential (primary) hypertension: Secondary | ICD-10-CM | POA: Diagnosis not present

## 2016-05-03 ENCOUNTER — Encounter: Payer: Self-pay | Admitting: Diagnostic Neuroimaging

## 2016-05-03 ENCOUNTER — Ambulatory Visit (INDEPENDENT_AMBULATORY_CARE_PROVIDER_SITE_OTHER): Payer: Medicare Other | Admitting: Diagnostic Neuroimaging

## 2016-05-03 VITALS — BP 137/87 | HR 54 | Wt 106.8 lb

## 2016-05-03 DIAGNOSIS — F0390 Unspecified dementia without behavioral disturbance: Secondary | ICD-10-CM | POA: Diagnosis not present

## 2016-05-03 DIAGNOSIS — F03C0 Unspecified dementia, severe, without behavioral disturbance, psychotic disturbance, mood disturbance, and anxiety: Secondary | ICD-10-CM

## 2016-05-03 MED ORDER — NAMENDA XR 28 MG PO CP24: 28.0000 mg | ORAL_CAPSULE | Freq: Every day | ORAL | 4 refills | Status: DC

## 2016-05-03 MED ORDER — DONEPEZIL HCL 10 MG PO TABS: 10.0000 mg | ORAL_TABLET | Freq: Every day | ORAL | 4 refills | Status: DC

## 2016-05-03 NOTE — Progress Notes (Signed)
[facility]  PATIENT: Beverly Edwards DOB: 09-May-1946  REFERRING CLINICIAN: Harlan Stains  HISTORY FROM: patient, husband Customer service manager) REASON FOR VISIT: existing patient    HISTORICAL  CHIEF COMPLAINT:  Chief Complaint  Patient presents with  . Dementia    rm 6, husband- Bronston, "patient is the same as 6 months ago"  . Follow-up    6 month    HISTORY OF PRESENT ILLNESS:   UPDATE 05/03/16 (VRP): Since last visit, memory loss and dementia is stable. Severe language difficulty.   UPDATE 10/29/15 (VRP): Since last visit, patient's dementia has significantly progressed. Patient lives at home with husband. She still able to feet or so. She needs supervision with bathing and dressing. Her language is significant.. She tends to repeat herself, make up nonsense words, humming music to herself, repeatedly fold clothes. She no longer drives. She has tendency to wander and therefore needs 24-hour supervision. She is on Namenda and donepezil. Sometimes she refuses to take nighttime donepezil. PCP tried Exelon patch but this was not tried due to cost. No reported hallucinations, anger outbursts or personality change. Patient goes to adult daycare center 3 times per week.  UPDATE 07/07/11 (VRP): Doing well. Maintaining most ADLs. Has given up driving. Still with some anxiety related to this visit. Tolerating meds. New complaint of right sided headache (intermittent). No assoc sxs. No HA today.  UPDATE 07/24/10 (CM): Memory has improved considerably, driving without difficulty, cooking and following recipes, independent in all ADL's doing yard work for relaxation. Does not get any regular exercise. Does not have any hobbies. Appears very anxious.  UPDATE 02/12/10 (VRP): Doing better.  Now retired and having better sleep schedule.  She is here with her husband.  No other complaints or issues.  We discussed diagnosis of dementia (likely Alzheimer's) and options for treatment.  PRIOR  HPI (01/07/10, VRP): 70 year old female with hypertension and hypothyroidism presenting for evaluation of forgetfulness and memory difficulty for the past few months. She is accompanied by her daughter for this visit. The patient has noticed that she has become somewhat forgetful particularly related to following instructions, tasks, lifts as well as conversation. She tends to repeat herself. 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 She is always worked the third shift (11 PM to 7 AM).  She reportedly gets only 3-5 hours of sleep per day. She works all night and tends to stay awake during the daytime. She has operated like this for many many years.  There are no reports of coworkers noticing memory difficulty. The patient's husband and daughter have noticed the memory difficulty. She underwent neuropsychological testing which revealed cognitive deficits in multiple domains. Conclusion was these may be do to combination of anxiety, sleep deprivation or underlying neuro degenerative disorder. Patient has not had an MRI scan of the brain.   REVIEW OF SYSTEMS: Full 14 system review of systems performed and negative with exception of: confusion hyperactive memory loss.  ALLERGIES: No Known Allergies  HOME MEDICATIONS: Outpatient Medications Prior to Visit  Medication Sig Dispense Refill  . donepezil (ARICEPT) 10 MG tablet Take 10 mg by mouth at bedtime.    Marland Kitchen levothyroxine (SYNTHROID, LEVOTHROID) 50 MCG tablet Take 50 mcg by mouth daily.  1  . lisinopril (PRINIVIL,ZESTRIL) 5 MG tablet 5 mg daily.  1  . NAMENDA XR 28 MG CP24 24 hr capsule Take 28 mg by mouth daily.  1   No facility-administered medications prior to visit.  PAST MEDICAL HISTORY: Past Medical History:  Diagnosis Date  . Cancer ([place]) 10/1998   r breast  . Dementia   . Heart murmur   . History of positive PPD   . Hypertension   . Hypothyroid    s/p radioactive iodine  . Memory loss   .  Osteopenia     PAST SURGICAL HISTORY: Past Surgical History:  Procedure Laterality Date  . BUNIONECTOMY    . CARPAL TUNNEL RELEASE Right   . FOOT FRACTURE SURGERY    . MYOMECTOMY      FAMILY HISTORY: Family History  Problem Relation Age of Onset  . Cancer Mother     SOCIAL HISTORY:  Social History   Social History  . Marital status: Married    Spouse name: Bronston  . Number of children: 2  . Years of education: 14   Occupational History  .      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 Smoking status: Never Smoker  . Smokeless tobacco: Never Used  . Alcohol use No     Comment: quit 2004  . Drug use: No  . Sexual activity: Not on file   Other Topics Concern  . Not on file   Social History Narrative   Lives with husband     PHYSICAL EXAM  GENERAL EXAM/CONSTITUTIONAL: Vitals:  Vitals:   05/03/16 1426  BP: 137/87  Pulse: (!) 54  Weight: 106 lb 12.8 oz (48.4 kg)   Body mass index is 18.33 kg/m. No exam data present  Patient is in no distress; well developed, nourished and groomed; neck is supple  CARDIOVASCULAR:  Examination of carotid arteries is normal; no carotid bruits  Regular rate and rhythm, no murmurs  Examination of peripheral vascular system by observation and palpation is normal  EYES:  Ophthalmoscopic exam of optic discs and posterior segments is normal; no papilledema or hemorrhages  MUSCULOSKELETAL:  Gait, strength, tone, movements noted in Neurologic exam below  NEUROLOGIC: MENTAL STATUS:  No flowsheet data found.  SEVERE DECR FLUENCY; ABLE TO SAY SIMPLE WORDS, BUT MANY WORD SUBSTITUTIONS, REPETITIVE WORDS, NON-SENSICAL WORDS, DOES NOT FOLLOW COMMANDS CONSISTENTLY; HUMMING SONGS AND GENTLY ROCKING  awake, alert, oriented to person  DECR MEMORY  DECR attention and concentration  DECR fund of knowledge  CRANIAL NERVE:   2nd, 3rd, 4th, 6th - pupils equal and reactive to light, visual fields full  to confrontation, extraocular muscles intact, no nystagmus  5th - facial sensation symmetric  7th - facial strength symmetric  8th - hearing intact  9th - palate elevates symmetrically, uvula midline  11th - shoulder shrug symmetric  12th - tongue protrusion midline  MOTOR:   normal bulk and tone, full strength in the BUE, BLE  REPETITIVE PICKING BEHAVIORS OF JACKET AND PANTS  SENSORY:   normal and symmetric to light touch  COORDINATION:   finger-nose-finger, fine finger movements normal  REFLEXES:   deep tendon reflexes present and symmetric  GAIT/STATION:   narrow based gait    DIAGNOSTIC DATA (LABS, IMAGING, TESTING) - I reviewed patient records, labs, notes, testing and imaging myself where available.  No results found for: WBC, HGB, HCT, MCV, PLT No results found for: NA, K, CL, CO2, GLUCOSE, BUN, CREATININE, CALCIUM, PROT, ALBUMIN, AST, ALT, ALKPHOS, BILITOT, GFRNONAA, GFRAA No results found for: CHOL, HDL, LDLCALC, LDLDIRECT, TRIG, CHOLHDL No results found for: HGBA1C No results found for: VITAMINB12 No results found for: TSH   01/12/10 MRI brain  1.  Mild diffuse and moderate bilateral mesial temporal atrophy. 2. Few non-specific foci of gliosis could be related to chronic small vessel ischemic disease.    ASSESSMENT AND PLAN  70 y.o. year old female here with progressive cognitive decline, language dysfunction, short-term memory loss, consistent with neurodegenerative dementia. Symptoms have significant progressed since 2011.   Dx:   1. Severe dementia      PLAN: I spent 15 minutes of face to face time with patient. Greater than 50% of time was spent in counseling and coordination of care with patient. In summary we discussed:  - disease diagnosis, prognosis, treatment options reviewed - continue donepezil and memantine - safety and supervision issues reviewed - follow up with PCP  Meds ordered this encounter  Medications  . donepezil  (ARICEPT) 10 MG tablet    Sig: Take 1 tablet (10 mg total) by mouth at bedtime.    Dispense:  90 tablet    Refill:  4  . NAMENDA XR 28 MG CP24 24 hr capsule    Sig: Take 1 capsule (28 mg total) by mouth daily.    Dispense:  90 capsule    Refill:  4   Return if symptoms worsen or fail to improve, for return to PCP.    Penni Bombard, MD [id number], [id number] PM Certified in Neurology, Neurophysiology and Neuroimaging  [facility] [address] [place], [place] [address] [phone]

## 2016-06-21 ENCOUNTER — Ambulatory Visit (INDEPENDENT_AMBULATORY_CARE_PROVIDER_SITE_OTHER): Payer: Medicare Other | Admitting: Orthopedic Surgery

## 2016-06-21 ENCOUNTER — Encounter (INDEPENDENT_AMBULATORY_CARE_PROVIDER_SITE_OTHER): Payer: Self-pay | Admitting: Orthopedic Surgery

## 2016-06-21 DIAGNOSIS — M79609 Pain in unspecified limb: Secondary | ICD-10-CM | POA: Diagnosis not present

## 2016-06-21 DIAGNOSIS — B351 Tinea unguium: Secondary | ICD-10-CM | POA: Diagnosis not present

## 2016-06-21 NOTE — Progress Notes (Signed)
   Office Visit Note   Patient: Beverly Edwards           Date of Birth: 10-29-1945           MRN: [id number] Visit Date: 06/21/2016              Requested by: Harlan Stains, MD [address], [place] [address] PCP: Vidal Schwalbe, MD   Assessment & Plan: Visit Diagnoses:  1. Pain due to onychomycosis of nail     Plan: Nails trimmed 10 follow-up in the office in 3 months for reevaluation of both feet.  Follow-Up Instructions: Return in about 3 months (around 09/18/2016).   Orders:  No orders of the defined types were placed in this encounter.  No orders of the defined types were placed in this encounter.     Procedures: No procedures performed   Clinical Data: No additional findings.   Subjective: Chief Complaint  Patient presents with  . Right Foot - Follow-up    Patient is here for a follow up of her right foot.  States right foot is doing fine.April Jackson, [place]    Review of Systems   Objective: Vital Signs: There were no vitals taken for this visit.  Physical Exam on examination patient is alert but she is not oriented she has significant dementia and this seems to be worsening each time I see her. Examination of both legs she has no venous stasis ulcers no plantar ulcers she has thickened discolored onychomycotic nails 10 with the nails curling over into the skin she is at risk of ulceration skin breakdown. After informed consent the nails were trimmed 10 without complications.  Ortho Exam  Specialty Comments:  No specialty comments available.  Imaging: No results found.   PMFS History: Patient Active Problem List   Diagnosis Date Noted  . Pain due to onychomycosis of nail 06/21/2016   Past Medical History:  Diagnosis Date  . Cancer ([place]) 10/1998   r breast  . Dementia   . Heart murmur   . History of positive PPD   . Hypertension   . Hypothyroid    s/p radioactive iodine  . Memory loss   . Osteopenia     Family  History  Problem Relation Age of Onset  . Cancer Mother     Past Surgical History:  Procedure Laterality Date  . BUNIONECTOMY    . CARPAL TUNNEL RELEASE Right   . FOOT FRACTURE SURGERY    . MYOMECTOMY     Social History   Occupational History  .      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 Smoking status: Never Smoker  . Smokeless tobacco: Never Used  . Alcohol use No     Comment: quit 2004  . Drug use: No  . Sexual activity: Not on file

## 2016-09-20 ENCOUNTER — Ambulatory Visit (INDEPENDENT_AMBULATORY_CARE_PROVIDER_SITE_OTHER): Payer: Medicare Other | Admitting: Orthopedic Surgery

## 2016-09-20 ENCOUNTER — Encounter (INDEPENDENT_AMBULATORY_CARE_PROVIDER_SITE_OTHER): Payer: Self-pay | Admitting: Orthopedic Surgery

## 2016-09-20 DIAGNOSIS — B351 Tinea unguium: Secondary | ICD-10-CM | POA: Diagnosis not present

## 2016-09-20 DIAGNOSIS — M79609 Pain in unspecified limb: Secondary | ICD-10-CM | POA: Diagnosis not present

## 2016-09-20 DIAGNOSIS — F0281 Dementia in other diseases classified elsewhere with behavioral disturbance: Secondary | ICD-10-CM | POA: Diagnosis not present

## 2016-09-20 DIAGNOSIS — G301 Alzheimer's disease with late onset: Secondary | ICD-10-CM | POA: Diagnosis not present

## 2016-09-20 DIAGNOSIS — F0280 Dementia in other diseases classified elsewhere without behavioral disturbance: Secondary | ICD-10-CM | POA: Insufficient documentation

## 2016-09-20 NOTE — Progress Notes (Signed)
   Office Visit Note   Patient: Beverly Edwards           Date of Birth: 1946/05/06           MRN: [id number] Visit Date: 09/20/2016              Requested by: Harlan Stains, MD [place], [place] [address] PCP: Harlan Stains, MD  Chief Complaint  Patient presents with  . Right Foot - Follow-up    Trim nails  . Left Foot - Follow-up    Trim nails      HPI: Patient is a 71-year-old woman with significant Alzheimer's dementia who is very confrontational presents with painful onychomycotic nails 10 she is unable to safely trim the nails on her own and family is unable to safely trim the nails due to patient's behavioral disturbance secondary to Alzheimer's.  Assessment & Plan: Visit Diagnoses:  1. Pain due to onychomycosis of nail   2. Late onset Alzheimer's disease with behavioral disturbance     Plan: Nails were trimmed 10 without complications follow-up in 3 months.  Follow-Up Instructions: Return in about 3 months (around 12/21/2016).   Ortho Exam  Patient is alert, not oriented, no adenopathy, well-dressed, repetitive speech and anxiety, normal respiratory effort. Examination patient has no plantar ulcers no venous stasis ulcers no signs of paronychial infection. She has thick and discolored onychomycotic nails 10. There is extreme difficulty with the patient fighting against the procedure all 10 nails were trimmed without complications.  Imaging: No results found.  Labs: No results found for: HGBA1C, ESRSEDRATE, CRP, LABURIC, REPTSTATUS, GRAMSTAIN, CULT, LABORGA  Orders:  No orders of the defined types were placed in this encounter.  No orders of the defined types were placed in this encounter.    Procedures: No procedures performed  Clinical Data: No additional findings.  ROS:  All other systems negative, except as noted in the HPI. Review of Systems  Objective: Vital Signs: There were no vitals taken for this  visit.  Specialty Comments:  No specialty comments available.  PMFS History: Patient Active Problem List   Diagnosis Date Noted  . Late onset Alzheimer's disease with behavioral disturbance 09/20/2016  . Pain due to onychomycosis of nail 06/21/2016   Past Medical History:  Diagnosis Date  . Cancer ([place]) 10/1998   r breast  . Dementia   . Heart murmur   . History of positive PPD   . Hypertension   . Hypothyroid    s/p radioactive iodine  . Memory loss   . Osteopenia     Family History  Problem Relation Age of Onset  . Cancer Mother     Past Surgical History:  Procedure Laterality Date  . BUNIONECTOMY    . CARPAL TUNNEL RELEASE Right   . FOOT FRACTURE SURGERY    . MYOMECTOMY     Social History   Occupational History  .      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 Smoking status: Never Smoker  . Smokeless tobacco: Never Used  . Alcohol use No     Comment: quit 2004  . Drug use: No  . Sexual activity: Not on file

## 2016-10-12 DIAGNOSIS — I10 Essential (primary) hypertension: Secondary | ICD-10-CM | POA: Diagnosis not present

## 2016-10-12 DIAGNOSIS — F0390 Unspecified dementia without behavioral disturbance: Secondary | ICD-10-CM | POA: Diagnosis not present

## 2016-10-12 DIAGNOSIS — E039 Hypothyroidism, unspecified: Secondary | ICD-10-CM | POA: Diagnosis not present

## 2016-10-12 DIAGNOSIS — F419 Anxiety disorder, unspecified: Secondary | ICD-10-CM | POA: Diagnosis not present

## 2016-11-11 DIAGNOSIS — A084 Viral intestinal infection, unspecified: Secondary | ICD-10-CM | POA: Diagnosis not present

## 2016-12-21 ENCOUNTER — Ambulatory Visit (INDEPENDENT_AMBULATORY_CARE_PROVIDER_SITE_OTHER): Payer: Medicare Other | Admitting: Orthopedic Surgery

## 2016-12-23 ENCOUNTER — Ambulatory Visit (INDEPENDENT_AMBULATORY_CARE_PROVIDER_SITE_OTHER): Payer: Medicare Other | Admitting: Orthopedic Surgery

## 2016-12-23 ENCOUNTER — Encounter (INDEPENDENT_AMBULATORY_CARE_PROVIDER_SITE_OTHER): Payer: Self-pay | Admitting: Orthopedic Surgery

## 2016-12-23 DIAGNOSIS — B351 Tinea unguium: Secondary | ICD-10-CM | POA: Diagnosis not present

## 2016-12-23 DIAGNOSIS — F0281 Dementia in other diseases classified elsewhere with behavioral disturbance: Secondary | ICD-10-CM | POA: Diagnosis not present

## 2016-12-23 DIAGNOSIS — F02818 Dementia in other diseases classified elsewhere, unspecified severity, with other behavioral disturbance: Secondary | ICD-10-CM

## 2016-12-23 DIAGNOSIS — G301 Alzheimer's disease with late onset: Secondary | ICD-10-CM | POA: Diagnosis not present

## 2016-12-23 NOTE — Progress Notes (Signed)
   Office Visit Note   Patient: Beverly Edwards           Date of Birth: 14-Oct-1945           MRN: [id number] Visit Date: 12/23/2016              Requested by: Harlan Stains, MD [place] [place], [place] [address] PCP: Harlan Stains, MD  Chief Complaint  Patient presents with  . Left Foot - Follow-up  . Right Foot - Follow-up      HPI: Patient is a 71 year old woman with progressive Alzheimer's dementia with thickened discolored onychomycotic nails which cannot safely be trimmed by the husband or the patient.  Assessment & Plan: Visit Diagnoses:  1. Late onset Alzheimer's disease with behavioral disturbance   2. Onychomycosis     Plan: Nails were trimmed 10 without complications. Follow-up for repeat foot evaluation and treatment.  Follow-Up Instructions: Return in about 3 months (around 03/25/2017).   Ortho Exam  Patient is not alert, not oriented, no adenopathy, well-dressed, repetitive nonsensical speech, normal respiratory effort. Patient has increasing progressive dementia. She is extremely combative. Examination of both lower extremities she has no venous stasis ulcers no plantar ulcers. She has extremely long ingrown onychomycotic nails that are growing into the skin there is no infection. After informed consent from the patient's husband and nails were trimmed 10. No signs of infection.  Imaging: No results found.  Labs: No results found for: HGBA1C, ESRSEDRATE, CRP, LABURIC, REPTSTATUS, GRAMSTAIN, CULT, LABORGA  Orders:  No orders of the defined types were placed in this encounter.  No orders of the defined types were placed in this encounter.    Procedures: No procedures performed  Clinical Data: No additional findings.  ROS:  All other systems negative, except as noted in the HPI. Review of Systems  Objective: Vital Signs: There were no vitals taken for this visit.  Specialty Comments:  No specialty comments  available.  PMFS History: Patient Active Problem List   Diagnosis Date Noted  . Late onset Alzheimer's disease with behavioral disturbance 09/20/2016  . Pain due to onychomycosis of nail 06/21/2016   Past Medical History:  Diagnosis Date  . Cancer ([place]) 10/1998   r breast  . Dementia   . Heart murmur   . History of positive PPD   . Hypertension   . Hypothyroid    s/p radioactive iodine  . Memory loss   . Osteopenia     Family History  Problem Relation Age of Onset  . Cancer Mother     Past Surgical History:  Procedure Laterality Date  . BUNIONECTOMY    . CARPAL TUNNEL RELEASE Right   . FOOT FRACTURE SURGERY    . MYOMECTOMY     Social History   Occupational History  .      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 Smoking status: Never Smoker  . Smokeless tobacco: Never Used  . Alcohol use No     Comment: quit 2004  . Drug use: No  . Sexual activity: Not on file

## 2017-03-24 ENCOUNTER — Encounter (INDEPENDENT_AMBULATORY_CARE_PROVIDER_SITE_OTHER): Payer: Self-pay | Admitting: Orthopedic Surgery

## 2017-03-24 ENCOUNTER — Ambulatory Visit (INDEPENDENT_AMBULATORY_CARE_PROVIDER_SITE_OTHER): Payer: Medicare Other | Admitting: Orthopedic Surgery

## 2017-03-24 DIAGNOSIS — B351 Tinea unguium: Secondary | ICD-10-CM | POA: Diagnosis not present

## 2017-03-24 DIAGNOSIS — M25572 Pain in left ankle and joints of left foot: Secondary | ICD-10-CM | POA: Diagnosis not present

## 2017-03-24 DIAGNOSIS — M25571 Pain in right ankle and joints of right foot: Secondary | ICD-10-CM

## 2017-03-24 DIAGNOSIS — G301 Alzheimer's disease with late onset: Secondary | ICD-10-CM

## 2017-03-24 DIAGNOSIS — F0281 Dementia in other diseases classified elsewhere with behavioral disturbance: Secondary | ICD-10-CM | POA: Diagnosis not present

## 2017-03-24 DIAGNOSIS — F02818 Dementia in other diseases classified elsewhere, unspecified severity, with other behavioral disturbance: Secondary | ICD-10-CM

## 2017-03-24 NOTE — Progress Notes (Signed)
   Office Visit Note   Patient: Beverly Edwards           Date of Birth: 08/18/1945           MRN: [id number] Visit Date: 03/24/2017              Requested by: Harlan Stains, MD [address] [place], [place] [address] PCP: Harlan Stains, MD  Chief Complaint  Patient presents with  . Right Foot - Follow-up  . Left Foot - Follow-up      HPI: Patient is a 71 year old woman with progressively advancing Alzheimer's dementia with increasing agitation.  Patient has thickened discolored onychomycotic nails x10 which are unable to be safely trimmed at home or in any other environment.  Assessment & Plan: Visit Diagnoses:  1. Late onset Alzheimer's disease with behavioral disturbance   2. Onychomycosis     Plan: The nails were trimmed I29 without complications.  Follow-Up Instructions: Return in about 3 months (around 06/24/2017).   Ortho Exam  Patient is alert,  Not oriented, no adenopathy, well-dressed, agitated affect, normal respiratory effort. Examination patient has repetitive incoherent speech.  Examination of both lower extremities she has no plantar ulcers no venous stasis ulcers.  Patient has thickened discolored onychomycotic nails x10 these are unable be safely trimmed at home.  With the patient's husband's help he restrain both arms.  Her legs were restrained and with much difficulty the nails were trimmed without complications N98 she tolerated this well.  She will need to follow-up every 3 months for nail care.  She is at risk for infection from her ingrown toenails.  Imaging: No results found. No images are attached to the encounter.  Labs: No results found for: HGBA1C, ESRSEDRATE, CRP, LABURIC, REPTSTATUS, GRAMSTAIN, CULT, LABORGA  Orders:  No orders of the defined types were placed in this encounter.  No orders of the defined types were placed in this encounter.    Procedures: No procedures performed  Clinical Data: No additional  findings.  ROS:  All other systems negative, except as noted in the HPI. Review of Systems  Objective: Vital Signs: There were no vitals taken for this visit.  Specialty Comments:  No specialty comments available.  PMFS History: Patient Active Problem List   Diagnosis Date Noted  . Late onset Alzheimer's disease with behavioral disturbance 09/20/2016  . Pain due to onychomycosis of nail 06/21/2016   Past Medical History:  Diagnosis Date  . Cancer ([place]) 10/1998   r breast  . Dementia   . Heart murmur   . History of positive PPD   . Hypertension   . Hypothyroid    s/p radioactive iodine  . Memory loss   . Osteopenia     Family History  Problem Relation Age of Onset  . Cancer Mother     Past Surgical History:  Procedure Laterality Date  . BUNIONECTOMY    . CARPAL TUNNEL RELEASE Right   . FOOT FRACTURE SURGERY    . 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 Smoking status: Never Smoker  . Smokeless tobacco: Never Used  Substance and Sexual Activity  . Alcohol use: No    Alcohol/week: 0.0 oz    Comment: quit 2004  . Drug use: No  . Sexual activity: Not on file

## 2017-04-14 DIAGNOSIS — E039 Hypothyroidism, unspecified: Secondary | ICD-10-CM | POA: Diagnosis not present

## 2017-04-14 DIAGNOSIS — F0390 Unspecified dementia without behavioral disturbance: Secondary | ICD-10-CM | POA: Diagnosis not present

## 2017-04-14 DIAGNOSIS — I10 Essential (primary) hypertension: Secondary | ICD-10-CM | POA: Diagnosis not present

## 2017-06-28 ENCOUNTER — Ambulatory Visit (INDEPENDENT_AMBULATORY_CARE_PROVIDER_SITE_OTHER): Payer: Medicare Other | Admitting: Orthopedic Surgery

## 2017-06-28 ENCOUNTER — Encounter (INDEPENDENT_AMBULATORY_CARE_PROVIDER_SITE_OTHER): Payer: Self-pay | Admitting: Orthopedic Surgery

## 2017-06-28 VITALS — Ht 64.0 in | Wt 106.0 lb

## 2017-06-28 DIAGNOSIS — B351 Tinea unguium: Secondary | ICD-10-CM

## 2017-06-28 DIAGNOSIS — F0281 Dementia in other diseases classified elsewhere with behavioral disturbance: Secondary | ICD-10-CM

## 2017-06-28 DIAGNOSIS — F02818 Dementia in other diseases classified elsewhere, unspecified severity, with other behavioral disturbance: Secondary | ICD-10-CM

## 2017-06-28 DIAGNOSIS — G301 Alzheimer's disease with late onset: Secondary | ICD-10-CM | POA: Diagnosis not present

## 2017-06-28 NOTE — Progress Notes (Signed)
   Office Visit Note   Patient: Beverly Edwards           Date of Birth: [birth year]           MRN: [id number] Visit Date: 06/28/2017              Requested by: Harlan Stains, MD [place], [place] [address] PCP: Harlan Stains, MD  Chief Complaint  Patient presents with  . Left Foot - Nail Problem    3 month nail trim   . Right Foot - Nail Problem      HPI: Patient is a 72 year old woman with progressive advancing dementia.  Patient is very agitated this morning she presents for evaluation for onychomycotic nails x10.  Assessment & Plan: Visit Diagnoses:  1. Late onset Alzheimer's disease with behavioral disturbance   2. Onychomycosis     Plan: Nails were trimmed K02 without complications.  Follow-up in 3 months for reevaluation.  Follow-Up Instructions: Return in about 3 months (around 09/25/2017).   Ortho Exam  Patient is not alert, not oriented, no adenopathy, well-dressed, agitated affect, normal respiratory effort. Examination she walks with assistance.  She has no venous stasis ulcers in the leg no plantar ulcers.  There is no swelling.  There is no cellulitis.  She does have thickened discolored onychomycotic nails x10 they are curling over and at risk of skin wounds or infection there is no paronychial infection at this time.  After informed consent with her husband the nails were trimmed R42 without complications.  Imaging: No results found. No images are attached to the encounter.  Labs: No results found for: HGBA1C, ESRSEDRATE, CRP, LABURIC, REPTSTATUS, GRAMSTAIN, CULT, LABORGA  @LABSALLVALUES (HGBA1)@  Body mass index is 18.19 kg/m.  Orders:  No orders of the defined types were placed in this encounter.  No orders of the defined types were placed in this encounter.    Procedures: No procedures performed  Clinical Data: No additional findings.  ROS:  All other systems negative, except as noted in the HPI. Review of  Systems  Objective: Vital Signs: Ht 5\' 4"  (1.626 m)   Wt 106 lb (48.1 kg)   BMI 18.19 kg/m   Specialty Comments:  No specialty comments available.  PMFS History: Patient Active Problem List   Diagnosis Date Noted  . Late onset Alzheimer's disease with behavioral disturbance 09/20/2016  . Pain due to onychomycosis of nail 06/21/2016   Past Medical History:  Diagnosis Date  . Cancer ([place]) 10/1998   r breast  . Dementia   . Heart murmur   . History of positive PPD   . Hypertension   . Hypothyroid    s/p radioactive iodine  . Memory loss   . Osteopenia     Family History  Problem Relation Age of Onset  . Cancer Mother     Past Surgical History:  Procedure Laterality Date  . BUNIONECTOMY    . CARPAL TUNNEL RELEASE Right   . FOOT FRACTURE SURGERY    . 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 Smoking status: Never Smoker  . Smokeless tobacco: Never Used  Substance and Sexual Activity  . Alcohol use: No    Alcohol/week: 0.0 oz    Comment: quit 2004  . Drug use: No  . Sexual activity: Not on file

## 2017-09-26 ENCOUNTER — Ambulatory Visit (INDEPENDENT_AMBULATORY_CARE_PROVIDER_SITE_OTHER): Payer: Medicare Other | Admitting: Orthopedic Surgery

## 2017-09-26 ENCOUNTER — Encounter (INDEPENDENT_AMBULATORY_CARE_PROVIDER_SITE_OTHER): Payer: Self-pay | Admitting: Orthopedic Surgery

## 2017-09-26 DIAGNOSIS — G301 Alzheimer's disease with late onset: Secondary | ICD-10-CM | POA: Diagnosis not present

## 2017-09-26 DIAGNOSIS — F0281 Dementia in other diseases classified elsewhere with behavioral disturbance: Secondary | ICD-10-CM

## 2017-09-26 DIAGNOSIS — B351 Tinea unguium: Secondary | ICD-10-CM | POA: Diagnosis not present

## 2017-09-26 DIAGNOSIS — F02818 Dementia in other diseases classified elsewhere, unspecified severity, with other behavioral disturbance: Secondary | ICD-10-CM

## 2017-09-26 NOTE — Progress Notes (Signed)
   Office Visit Note   Patient: Beverly Edwards           Date of Birth: 1945-09-11           MRN: [id number] Visit Date: 09/26/2017              Requested by: Harlan Stains, MD [place], [place] [address] PCP: Harlan Stains, MD  Chief Complaint  Patient presents with  . Left Foot - Nail Problem, Follow-up  . Right Foot - Nail Problem, Follow-up      HPI: Patient is a 72 year old woman with advanced Alzheimer's dementia she is very agitated and resistive to evaluation and treatment.  Assessment & Plan: Visit Diagnoses:  1. Late onset Alzheimer's disease with behavioral disturbance   2. Onychomycosis     Plan: Nails were trimmed the patient was extremely confrontational and required both her husband and myself to restrain her in order to trim her nails.  Follow-Up Instructions: Return in about 3 months (around 12/27/2017).   Ortho Exam  Patient is alert, oriented, no adenopathy, well-dressed, normal affect, normal respiratory effort. Examination patient is not alert or oriented.  She has thickened discolored onychomycotic nails in both lower extremities there is no infection but patient is at risk of injury or infection with the thickened onychomycotic nails.  With the assistance of her husband and myself restraining her we were able to trim her nails without complications A25.  Imaging: No results found. No images are attached to the encounter.  Labs: No results found for: HGBA1C, ESRSEDRATE, CRP, LABURIC, REPTSTATUS, GRAMSTAIN, CULT, LABORGA   No results found for: ALBUMIN, PREALBUMIN, LABURIC  There is no height or weight on file to calculate BMI.  Orders:  No orders of the defined types were placed in this encounter.  No orders of the defined types were placed in this encounter.    Procedures: No procedures performed  Clinical Data: No additional findings.  ROS:  All other systems negative, except as noted in the HPI. Review  of Systems  Objective: Vital Signs: There were no vitals taken for this visit.  Specialty Comments:  No specialty comments available.  PMFS History: Patient Active Problem List   Diagnosis Date Noted  . Late onset Alzheimer's disease with behavioral disturbance 09/20/2016  . Pain due to onychomycosis of nail 06/21/2016   Past Medical History:  Diagnosis Date  . Cancer ([place]) 10/1998   r breast  . Dementia   . Heart murmur   . History of positive PPD   . Hypertension   . Hypothyroid    s/p radioactive iodine  . Memory loss   . Osteopenia     Family History  Problem Relation Age of Onset  . Cancer Mother     Past Surgical History:  Procedure Laterality Date  . BUNIONECTOMY    . CARPAL TUNNEL RELEASE Right   . FOOT FRACTURE SURGERY    . 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 Smoking status: Never Smoker  . Smokeless tobacco: Never Used  Substance and Sexual Activity  . Alcohol use: No    Alcohol/week: 0.0 oz    Comment: quit 2004  . Drug use: No  . Sexual activity: Not on file

## 2017-10-12 DIAGNOSIS — E039 Hypothyroidism, unspecified: Secondary | ICD-10-CM | POA: Diagnosis not present

## 2017-10-12 DIAGNOSIS — I10 Essential (primary) hypertension: Secondary | ICD-10-CM | POA: Diagnosis not present

## 2017-10-12 DIAGNOSIS — F0390 Unspecified dementia without behavioral disturbance: Secondary | ICD-10-CM | POA: Diagnosis not present

## 2017-10-12 DIAGNOSIS — E440 Moderate protein-calorie malnutrition: Secondary | ICD-10-CM | POA: Diagnosis not present

## 2017-12-15 ENCOUNTER — Encounter: Payer: Self-pay | Admitting: Family Medicine

## 2017-12-27 ENCOUNTER — Ambulatory Visit (INDEPENDENT_AMBULATORY_CARE_PROVIDER_SITE_OTHER): Payer: Medicare Other | Admitting: Orthopedic Surgery

## 2017-12-27 ENCOUNTER — Encounter (INDEPENDENT_AMBULATORY_CARE_PROVIDER_SITE_OTHER): Payer: Self-pay | Admitting: Orthopedic Surgery

## 2017-12-27 DIAGNOSIS — B351 Tinea unguium: Secondary | ICD-10-CM | POA: Diagnosis not present

## 2017-12-27 DIAGNOSIS — G301 Alzheimer's disease with late onset: Secondary | ICD-10-CM | POA: Diagnosis not present

## 2017-12-27 DIAGNOSIS — F0281 Dementia in other diseases classified elsewhere with behavioral disturbance: Secondary | ICD-10-CM

## 2018-01-11 ENCOUNTER — Encounter (INDEPENDENT_AMBULATORY_CARE_PROVIDER_SITE_OTHER): Payer: Self-pay | Admitting: Orthopedic Surgery

## 2018-01-11 NOTE — Progress Notes (Signed)
   Office Visit Note   Patient: Beverly Edwards           Date of Birth: 1946-04-10           MRN: [id number] Visit Date: 12/27/2017              Requested by: Harlan Stains, MD [place], [place] [address] PCP: Harlan Stains, MD  Chief Complaint  Patient presents with  . Right Foot - Follow-up  . Left Foot - Follow-up      HPI: Patient is a 72 year old woman with Alzheimer's dementia who presents for evaluation of both lower extremities.  Assessment & Plan: Visit Diagnoses:  1. Late onset Alzheimer's disease with behavioral disturbance   2. Onychomycosis     Plan: Nails were trimmed X79 without complications.  Follow-up for reevaluation in 3 months.  Follow-Up Instructions: Return in about 3 months (around 03/29/2018).   Ortho Exam  Patient is alert, oriented, no adenopathy, well-dressed, normal affect, normal respiratory effort. Examination patient is agitated with repetitive speech she has thickened discolored onychomycotic nails x10 she is unable to safely trim on her own.  She has good pulses there is no evidence of a paronychial infection and no plantar ulcers no venous stasis ulcers.  After informed consent with the patient's husband with several people holding her in the extremity the nails were trimmed T90 without complications.  Imaging: No results found. No images are attached to the encounter.  Labs: No results found for: HGBA1C, ESRSEDRATE, CRP, LABURIC, REPTSTATUS, GRAMSTAIN, CULT, LABORGA   No results found for: ALBUMIN, PREALBUMIN, LABURIC  There is no height or weight on file to calculate BMI.  Orders:  No orders of the defined types were placed in this encounter.  No orders of the defined types were placed in this encounter.    Procedures: No procedures performed  Clinical Data: No additional findings.  ROS:  All other systems negative, except as noted in the HPI. Review of Systems  Objective: Vital Signs:  There were no vitals taken for this visit.  Specialty Comments:  No specialty comments available.  PMFS History: Patient Active Problem List   Diagnosis Date Noted  . Late onset Alzheimer's disease with behavioral disturbance 09/20/2016  . Pain due to onychomycosis of nail 06/21/2016   Past Medical History:  Diagnosis Date  . Cancer ([place]) 10/1998   r breast  . Dementia   . Heart murmur   . History of positive PPD   . Hypertension   . Hypothyroid    s/p radioactive iodine  . Memory loss   . Osteopenia     Family History  Problem Relation Age of Onset  . Cancer Mother     Past Surgical History:  Procedure Laterality Date  . BUNIONECTOMY    . CARPAL TUNNEL RELEASE Right   . FOOT FRACTURE SURGERY    . 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 Smoking status: Never Smoker  . Smokeless tobacco: Never Used  Substance and Sexual Activity  . Alcohol use: No    Alcohol/week: 0.0 standard drinks    Comment: quit 2004  . Drug use: No  . Sexual activity: Not on file

## 2018-02-01 ENCOUNTER — Ambulatory Visit: Payer: Self-pay | Admitting: Internal Medicine

## 2018-02-09 ENCOUNTER — Ambulatory Visit (INDEPENDENT_AMBULATORY_CARE_PROVIDER_SITE_OTHER): Payer: Medicare Other | Admitting: Internal Medicine

## 2018-02-09 ENCOUNTER — Encounter: Payer: Self-pay | Admitting: Internal Medicine

## 2018-02-09 VITALS — BP 140/80 | Ht 64.0 in | Wt 96.0 lb

## 2018-02-09 DIAGNOSIS — F0281 Dementia in other diseases classified elsewhere with behavioral disturbance: Secondary | ICD-10-CM

## 2018-02-09 DIAGNOSIS — I10 Essential (primary) hypertension: Secondary | ICD-10-CM

## 2018-02-09 DIAGNOSIS — G301 Alzheimer's disease with late onset: Secondary | ICD-10-CM | POA: Diagnosis not present

## 2018-02-09 DIAGNOSIS — E039 Hypothyroidism, unspecified: Secondary | ICD-10-CM | POA: Diagnosis not present

## 2018-02-09 DIAGNOSIS — S99921S Unspecified injury of right foot, sequela: Secondary | ICD-10-CM

## 2018-02-09 DIAGNOSIS — F02818 Dementia in other diseases classified elsewhere, unspecified severity, with other behavioral disturbance: Secondary | ICD-10-CM
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
## 2018-03-30 ENCOUNTER — Ambulatory Visit (INDEPENDENT_AMBULATORY_CARE_PROVIDER_SITE_OTHER): Payer: Medicare Other | Admitting: Orthopedic Surgery

## 2018-03-30 DIAGNOSIS — F0281 Dementia in other diseases classified elsewhere with behavioral disturbance: Secondary | ICD-10-CM | POA: Diagnosis not present

## 2018-03-30 DIAGNOSIS — G301 Alzheimer's disease with late onset: Secondary | ICD-10-CM

## 2018-03-30 DIAGNOSIS — F02818 Dementia in other diseases classified elsewhere, unspecified severity, with other behavioral disturbance: Secondary | ICD-10-CM

## 2018-03-30 DIAGNOSIS — B351 Tinea unguium: Secondary | ICD-10-CM

## 2018-04-03 ENCOUNTER — Encounter (INDEPENDENT_AMBULATORY_CARE_PROVIDER_SITE_OTHER): Payer: Self-pay | Admitting: Orthopedic Surgery

## 2018-04-03 NOTE — Progress Notes (Signed)
   Office Visit Note   Patient: Beverly Edwards           Date of Birth: [birth year]           MRN: [id number] Visit Date: 03/30/2018              Requested by: Harlan Stains, MD [place], [place] [address] PCP: Harlan Stains, MD  No chief complaint on file.     HPI: Patient is a 72 year old woman with advanced dementia who presents for evaluation of onychomycotic nails x10.  These are on safe to be trimmed at the home environment.  Assessment & Plan: Visit Diagnoses:  1. Late onset Alzheimer's disease with behavioral disturbance ([place])   2. Onychomycosis     Plan: Nails were trimmed X32 without complications.  Follow-Up Instructions: Return in about 3 months (around 06/30/2018).   Ortho Exam  Patient is alert, oriented, no adenopathy, well-dressed, normal affect, normal respiratory effort. Examination patient has advanced Alzheimer's dementia with behavioral disturbance.  Patient required 2 people to restrain her to trim her nails.  The nails were trimmed T55 without complications.  There is no signs of paronychial infection.  Imaging: No results found. No images are attached to the encounter.  Labs: No results found for: HGBA1C, ESRSEDRATE, CRP, LABURIC, REPTSTATUS, GRAMSTAIN, CULT, LABORGA   No results found for: ALBUMIN, PREALBUMIN, LABURIC  There is no height or weight on file to calculate BMI.  Orders:  No orders of the defined types were placed in this encounter.  No orders of the defined types were placed in this encounter.    Procedures: No procedures performed  Clinical Data: No additional findings.  ROS:  All other systems negative, except as noted in the HPI. Review of Systems  Objective: Vital Signs: There were no vitals taken for this visit.  Specialty Comments:  No specialty comments available.  PMFS History: Patient Active Problem List   Diagnosis Date Noted  . Hypothyroidism 02/09/2018  . Benign essential  HTN 02/09/2018  . Foot injury, right, sequela 02/09/2018  . Late onset Alzheimer's disease with behavioral disturbance ([place]) 09/20/2016  . Pain due to onychomycosis of nail 06/21/2016   Past Medical History:  Diagnosis Date  . Cancer ([place]) 10/1998   r breast  . Dementia ([place])   . Heart murmur   . History of positive PPD   . Hypertension   . Hypothyroid    s/p radioactive iodine  . Memory loss   . Osteopenia     Family History  Problem Relation Age of Onset  . Cancer Mother     Past Surgical History:  Procedure Laterality Date  . BUNIONECTOMY    . CARPAL TUNNEL RELEASE Right   . FOOT FRACTURE SURGERY    . 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 Smoking status: Never Smoker  . Smokeless tobacco: Never Used  Substance and Sexual Activity  . Alcohol use: No    Alcohol/week: 0.0 standard drinks    Comment: quit 2004  . Drug use: No  . Sexual activity: Not on file

## 2018-04-18 DIAGNOSIS — E440 Moderate protein-calorie malnutrition: Secondary | ICD-10-CM | POA: Diagnosis not present

## 2018-04-18 DIAGNOSIS — E559 Vitamin D deficiency, unspecified: Secondary | ICD-10-CM | POA: Diagnosis not present

## 2018-04-18 DIAGNOSIS — F0390 Unspecified dementia without behavioral disturbance: Secondary | ICD-10-CM | POA: Diagnosis not present

## 2018-04-18 DIAGNOSIS — I10 Essential (primary) hypertension: Secondary | ICD-10-CM | POA: Diagnosis not present

## 2018-04-18 DIAGNOSIS — R54 Age-related physical debility: Secondary | ICD-10-CM | POA: Diagnosis not present

## 2018-04-18 DIAGNOSIS — E039 Hypothyroidism, unspecified: Secondary | ICD-10-CM | POA: Diagnosis not present

## 2018-06-29 ENCOUNTER — Ambulatory Visit (INDEPENDENT_AMBULATORY_CARE_PROVIDER_SITE_OTHER): Payer: Medicare HMO | Admitting: Orthopedic Surgery

## 2018-06-29 ENCOUNTER — Encounter (INDEPENDENT_AMBULATORY_CARE_PROVIDER_SITE_OTHER): Payer: Self-pay | Admitting: Orthopedic Surgery

## 2018-06-29 DIAGNOSIS — F02818 Dementia in other diseases classified elsewhere, unspecified severity, with other behavioral disturbance: Secondary | ICD-10-CM

## 2018-06-29 DIAGNOSIS — G301 Alzheimer's disease with late onset: Secondary | ICD-10-CM | POA: Diagnosis not present

## 2018-06-29 DIAGNOSIS — F0281 Dementia in other diseases classified elsewhere with behavioral disturbance: Secondary | ICD-10-CM

## 2018-06-29 DIAGNOSIS — R69 Illness, unspecified: Secondary | ICD-10-CM | POA: Diagnosis not present

## 2018-06-29 DIAGNOSIS — B351 Tinea unguium: Secondary | ICD-10-CM

## 2018-06-30 ENCOUNTER — Encounter (INDEPENDENT_AMBULATORY_CARE_PROVIDER_SITE_OTHER): Payer: Self-pay | Admitting: Orthopedic Surgery
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
## 2018-07-10 ENCOUNTER — Ambulatory Visit (INDEPENDENT_AMBULATORY_CARE_PROVIDER_SITE_OTHER): Payer: Medicare HMO | Admitting: Diagnostic Neuroimaging

## 2018-07-10 ENCOUNTER — Encounter: Payer: Self-pay | Admitting: Diagnostic Neuroimaging

## 2018-07-10 VITALS — Ht 64.0 in | Wt 93.0 lb

## 2018-07-10 DIAGNOSIS — F0390 Unspecified dementia without behavioral disturbance: Secondary | ICD-10-CM | POA: Diagnosis not present

## 2018-07-10 DIAGNOSIS — R69 Illness, unspecified: Secondary | ICD-10-CM | POA: Diagnosis not present

## 2018-07-10 DIAGNOSIS — F03C0 Unspecified dementia, severe, without behavioral disturbance, psychotic disturbance, mood disturbance, and anxiety: Secondary | ICD-10-CM

## 2018-07-10 NOTE — Patient Instructions (Signed)
-   safety / supervision issues reviewed  - caregiver resources provided

## 2018-07-10 NOTE — Progress Notes (Signed)
[facility]  PATIENT: Beverly Edwards DOB: 12/27/1945  REFERRING CLINICIAN: Harlan Stains  HISTORY FROM: patient, husband Customer service manager) REASON FOR VISIT: existing patient    HISTORICAL  CHIEF COMPLAINT:  Chief Complaint  Patient presents with  . Dementia    rm 7, husband- [place], "constantly moving, roaming when awake, she's not leaving home, sleeping at night"  . Follow-up    HISTORY OF PRESENT ILLNESS:   UPDATE (07/10/18, VRP): Since last visit, symptoms have progressed.  Patient not able to speaking any intelligible sentences anymore.  She is eating well and sleeping well.  She tends to run around her home, reorganized papers and objects, but is otherwise calm and cooperative.  Patient not able to take her medications that well. Symptoms are progressive.  Patient needs help with bathing and dressing.  Her mobility is fairly good.  She does go outside of the home with the family but has 24-hour supervision.  She was previously in a daycare program at [facility], but was not able to participate anymore.  Patient's husband lives at home with her.  Patient's daughter also helps out quite a bit.  UPDATE 05/03/16 (VRP): Since last visit, memory loss and dementia is stable. Severe language difficulty.   UPDATE 10/29/15 (VRP): Since last visit, patient's dementia has significantly progressed. Patient lives at home with husband. She still able to feet or so. She needs supervision with bathing and dressing. Her language is significant.. She tends to repeat herself, make up nonsense words, humming music to herself, repeatedly fold clothes. She no longer drives. She has tendency to wander and therefore needs 24-hour supervision. She is on Namenda and donepezil. Sometimes she refuses to take nighttime donepezil. PCP tried Exelon patch but this was not tried due to cost. No reported hallucinations, anger outbursts or personality change. Patient goes to adult daycare center 3 times  per week.  UPDATE 07/07/11 (VRP): Doing well. Maintaining most ADLs. Has given up driving. Still with some anxiety related to this visit. Tolerating meds. New complaint of right sided headache (intermittent). No assoc sxs. No HA today.  UPDATE 07/24/10 (CM): Memory has improved considerably, driving without difficulty, cooking and following recipes, independent in all ADL's doing yard work for relaxation. Does not get any regular exercise. Does not have any hobbies. Appears very anxious.  UPDATE 02/12/10 (VRP): Doing better.  Now retired and having better sleep schedule.  She is here with her husband.  No other complaints or issues.  We discussed diagnosis of dementia (likely Alzheimer's) and options for treatment.  PRIOR HPI (01/07/10, VRP): 73 year old female with hypertension and hypothyroidism presenting for evaluation of forgetfulness and memory difficulty for the past few months. She is accompanied by her daughter for this visit. The patient has noticed that she has become somewhat forgetful particularly related to following instructions, tasks, lifts as well as conversation. She tends to repeat herself. 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 She is always worked the third shift (11 PM to 7 AM).  She reportedly gets only 3-5 hours of sleep per day. She works all night and tends to stay awake during the daytime. She has operated like this for many many years.  There are no reports of coworkers noticing memory difficulty. The patient's husband and daughter have noticed the memory difficulty. She underwent neuropsychological testing which revealed cognitive deficits in multiple domains. Conclusion was these may be do to combination of anxiety, sleep deprivation or underlying neuro  degenerative disorder. Patient has not had an MRI scan of the brain.   REVIEW OF SYSTEMS: Full 14 system review of systems performed and negative with exception of: confusion hyperactive memory  loss.  ALLERGIES: No Known Allergies  HOME MEDICATIONS: Outpatient Medications Prior to Visit  Medication Sig Dispense Refill  . donepezil (ARICEPT) 10 MG tablet Take 1 tablet (10 mg total) by mouth at bedtime. 90 tablet 4  . levothyroxine (SYNTHROID, LEVOTHROID) 75 MCG tablet Take 75 mcg by mouth daily.  1  . lisinopril (PRINIVIL,ZESTRIL) 10 MG tablet Take 10 mg by mouth daily.  1   No facility-administered medications prior to visit.     PAST MEDICAL HISTORY: Past Medical History:  Diagnosis Date  . Cancer ([place]) 10/1998   r breast  . Dementia ([place])   . Heart murmur   . History of positive PPD   . Hypertension   . Hypothyroid    s/p radioactive iodine  . Memory loss   . Osteopenia     PAST SURGICAL HISTORY: Past Surgical History:  Procedure Laterality Date  . BUNIONECTOMY    . CARPAL TUNNEL RELEASE Right   . FOOT FRACTURE SURGERY    . MYOMECTOMY      FAMILY HISTORY: Family History  Problem Relation Age of Onset  . Cancer Mother     SOCIAL HISTORY:  Social History   Socioeconomic History  . Marital status: Married    Spouse name: [place]ston  . Number of children: 2  . Years of education: 17  . 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 Financial resource strain: Not on file  . Food insecurity:    Worry: Not on file    Inability: Not on file  . Transportation needs:    Medical: Not on file    Non-medical: Not on file  Tobacco Use  . Smoking status: Never Smoker  . Smokeless tobacco: Never Used  Substance and Sexual Activity  . Alcohol use: No    Alcohol/week: 0.0 standard drinks    Comment: quit 2004  . Drug use: No  . Sexual activity: Not on file  Lifestyle  . Physical activity:    Days per week: Not on file    Minutes per session: Not on file  . Stress: Not on file  Relationships  . Social connections:    Talks on phone: Not on file    Gets together: Not on file     Attends religious service: Not on file    Active member of club or organization: Not on file    Attends meetings of clubs or organizations: Not on file    Relationship status: Not on file  . Intimate partner violence:    Fear of current or ex partner: Not on file    Emotionally abused: Not on file    Physically abused: Not on file    Forced sexual activity: Not on file  Other Topics Concern  . Not on file  Social History Narrative   07/10/2018 Lives with husband     PHYSICAL EXAM  GENERAL EXAM/CONSTITUTIONAL: Vitals:  Vitals:   07/10/18 1507  Weight: 93 lb (42.2 kg)  Height: 5\' 4"  (1.626 m)   Body mass index is 15.96 kg/m. No exam data present  Patient is in no distress; well developed, nourished and groomed; neck is supple  CARDIOVASCULAR:  Examination of carotid arteries is normal; no carotid bruits  Regular rate and rhythm, no murmurs  Examination of peripheral vascular system by observation and palpation is normal  EYES:  Ophthalmoscopic exam of optic discs and posterior segments is normal; no papilledema or hemorrhages  MUSCULOSKELETAL:  Gait, strength, tone, movements noted in Neurologic exam below  NEUROLOGIC: MENTAL STATUS:  No flowsheet data found.  SEVERE DECR FLUENCY; ABLE TO SAY SIMPLE WORDS, BUT MANY WORD SUBSTITUTIONS, REPETITIVE WORDS, NON-SENSICAL WORDS, DOES NOT FOLLOW COMMANDS CONSISTENTLY; HUMMING SONGS AND GENTLY ROCKING  awake, alert, oriented to person  DECR MEMORY  DECR attention and concentration  DECR fund of knowledge  CRANIAL NERVE:   2nd, 3rd, 4th, 6th - pupils equal and reactive to light, visual fields full to confrontation, extraocular muscles intact, no nystagmus  5th - facial sensation symmetric  7th - facial strength symmetric  8th - hearing intact  9th - palate elevates symmetrically, uvula midline  11th - shoulder shrug symmetric  12th - tongue protrusion midline  MOTOR:   normal bulk and tone, full  strength in the BUE, BLE  REPETITIVE WALKING AROUND ROOM; STRAIGHTENING OBJECTS AND PAPERS  SENSORY:   normal and symmetric to light touch  COORDINATION:   finger-nose-finger, fine finger movements normal  REFLEXES:   deep tendon reflexes trace and symmetric  GAIT/STATION:   narrow based gait    DIAGNOSTIC DATA (LABS, IMAGING, TESTING) - I reviewed patient records, labs, notes, testing and imaging myself where available.  No results found for: WBC, HGB, HCT, MCV, PLT No results found for: NA, K, CL, CO2, GLUCOSE, BUN, CREATININE, CALCIUM, PROT, ALBUMIN, AST, ALT, ALKPHOS, BILITOT, GFRNONAA, GFRAA No results found for: CHOL, HDL, LDLCALC, LDLDIRECT, TRIG, CHOLHDL No results found for: HGBA1C No results found for: VITAMINB12 No results found for: TSH   01/12/10 MRI brain  1. Mild diffuse and moderate bilateral mesial temporal atrophy. 2. Few non-specific foci of gliosis could be related to chronic small vessel ischemic disease.    ASSESSMENT AND PLAN  73 y.o. year old female here with progressive cognitive decline, language dysfunction, short-term memory loss, consistent with neurodegenerative dementia. Symptoms have significant progressed since 2011.   Dx:   1. Severe dementia ([place])      PLAN:  SEVERE DEMENTIA  (worsening) - disease diagnosis, prognosis, treatment options reviewed - safety and supervision issues reviewed - follow up with PCP  Return for return to PCP, pending if symptoms worsen or fail to improve.    Penni Bombard, MD 0/62/6948, 5:46 PM Certified in Neurology, Neurophysiology and Neuroimaging  [facility] [address] [place], [place] [address] [phone]

## 2018-09-26 ENCOUNTER — Ambulatory Visit (INDEPENDENT_AMBULATORY_CARE_PROVIDER_SITE_OTHER): Payer: Medicare HMO | Admitting: Orthopedic Surgery

## 2018-09-28 ENCOUNTER — Other Ambulatory Visit: Payer: Self-pay

## 2018-09-28 ENCOUNTER — Encounter: Payer: Self-pay | Admitting: Orthopedic Surgery

## 2018-09-28 ENCOUNTER — Ambulatory Visit (INDEPENDENT_AMBULATORY_CARE_PROVIDER_SITE_OTHER): Payer: Medicare HMO | Admitting: Orthopedic Surgery

## 2018-09-28 VITALS — Ht 64.0 in | Wt 93.0 lb

## 2018-09-28 DIAGNOSIS — G301 Alzheimer's disease with late onset: Secondary | ICD-10-CM

## 2018-09-28 DIAGNOSIS — F02818 Dementia in other diseases classified elsewhere, unspecified severity, with other behavioral disturbance: Secondary | ICD-10-CM

## 2018-09-28 DIAGNOSIS — B351 Tinea unguium: Secondary | ICD-10-CM

## 2018-09-28 DIAGNOSIS — R69 Illness, unspecified: Secondary | ICD-10-CM | POA: Diagnosis not present

## 2018-09-28 DIAGNOSIS — F0281 Dementia in other diseases classified elsewhere with behavioral disturbance: Secondary | ICD-10-CM | POA: Diagnosis not present

## 2018-09-28 NOTE — Progress Notes (Signed)
   Office Visit Note   Patient: Beverly Edwards           Date of Birth: 1945-11-07           MRN: [id number] Visit Date: 09/28/2018              Requested by: Harlan Stains, MD [address] [place], [place] [address] PCP: Harlan Stains, MD  Chief Complaint  Patient presents with  . Left Foot - Follow-up  . Right Foot - Follow-up      HPI: Patient is a 73 year old woman with advanced dementia from Alzheimer's who has onychomycotic nails x10 she is unable to safely trim the nails on her own she is very combative and presents for evaluation of both feet.  Assessment & Plan: Visit Diagnoses:  1. Late onset Alzheimer's disease with behavioral disturbance ([place])   2. Onychomycosis     Plan: Nails were trimmed N23 without complications.  Follow-Up Instructions: Return in about 3 months (around 12/29/2018).   Ortho Exam  Patient is alert, oriented, no adenopathy, well-dressed, normal affect, normal respiratory effort. Examination patient has no venous or plantar ulcers on her feet.  There is no evidence of any paronychial infection.  She has thickened discolored onychomycotic nails.  She is unable to safely trim them on her own and after informed consent with her husband the nails were trimmed F57 without complications.  Imaging: No results found. No images are attached to the encounter.  Labs: No results found for: HGBA1C, ESRSEDRATE, CRP, LABURIC, REPTSTATUS, GRAMSTAIN, CULT, LABORGA   No results found for: ALBUMIN, PREALBUMIN, LABURIC  Body mass index is 15.96 kg/m.  Orders:  No orders of the defined types were placed in this encounter.  No orders of the defined types were placed in this encounter.    Procedures: No procedures performed  Clinical Data: No additional findings.  ROS:  All other systems negative, except as noted in the HPI. Review of Systems  Objective: Vital Signs: Ht 5\' 4"  (1.626 m)   Wt 93 lb (42.2 kg)   BMI 15.96 kg/m   Specialty Comments:  No specialty comments available.  PMFS History: Patient Active Problem List   Diagnosis Date Noted  . Hypothyroidism 02/09/2018  . Benign essential HTN 02/09/2018  . Foot injury, right, sequela 02/09/2018  . Late onset Alzheimer's disease with behavioral disturbance ([place]) 09/20/2016  . Pain due to onychomycosis of nail 06/21/2016   Past Medical History:  Diagnosis Date  . Cancer ([place]) 10/1998   r breast  . Dementia ([place])   . Heart murmur   . History of positive PPD   . Hypertension   . Hypothyroid    s/p radioactive iodine  . Memory loss   . Osteopenia     Family History  Problem Relation Age of Onset  . Cancer Mother     Past Surgical History:  Procedure Laterality Date  . BUNIONECTOMY    . CARPAL TUNNEL RELEASE Right   . FOOT FRACTURE SURGERY    . 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 Smoking status: Never Smoker  . Smokeless tobacco: Never Used  Substance and Sexual Activity  . Alcohol use: No    Alcohol/week: 0.0 standard drinks    Comment: quit 2004  . Drug use: No  . Sexual activity: Not on file

## 2018-11-22 DIAGNOSIS — R69 Illness, unspecified: Secondary | ICD-10-CM | POA: Diagnosis not present

## 2018-11-22 DIAGNOSIS — E039 Hypothyroidism, unspecified: Secondary | ICD-10-CM | POA: Diagnosis not present

## 2018-11-22 DIAGNOSIS — E440 Moderate protein-calorie malnutrition: Secondary | ICD-10-CM | POA: Diagnosis not present

## 2018-11-22 DIAGNOSIS — I10 Essential (primary) hypertension: Secondary | ICD-10-CM | POA: Diagnosis not present

## 2018-12-28 ENCOUNTER — Encounter: Payer: Self-pay | Admitting: Orthopedic Surgery

## 2018-12-28 ENCOUNTER — Ambulatory Visit (INDEPENDENT_AMBULATORY_CARE_PROVIDER_SITE_OTHER): Payer: Medicare HMO | Admitting: Orthopedic Surgery

## 2018-12-28 VITALS — Ht 64.0 in | Wt 93.0 lb

## 2018-12-28 DIAGNOSIS — G301 Alzheimer's disease with late onset: Secondary | ICD-10-CM

## 2018-12-28 DIAGNOSIS — F0281 Dementia in other diseases classified elsewhere with behavioral disturbance: Secondary | ICD-10-CM

## 2018-12-28 DIAGNOSIS — B351 Tinea unguium: Secondary | ICD-10-CM

## 2018-12-28 DIAGNOSIS — R69 Illness, unspecified: Secondary | ICD-10-CM | POA: Diagnosis not present

## 2019-01-13 ENCOUNTER — Encounter: Payer: Self-pay | Admitting: Orthopedic Surgery

## 2019-01-13 NOTE — Progress Notes (Signed)
   Office Visit Note   Patient: Beverly Edwards           Date of Birth: 22-Jun-1945           MRN: [id number] Visit Date: 12/28/2018              Requested by: Harlan Stains, MD [address] [place],  [place] [address] PCP: Harlan Stains, MD  Chief Complaint  Patient presents with  . Right Foot - Follow-up  . Left Foot - Follow-up      HPI: Patient is a 73 year old woman with advanced dementia with behavioral disturbances who presents for evaluation of both feet.  Assessment & Plan: Visit Diagnoses:  1. Onychomycosis   2. Late onset Alzheimer's disease with behavioral disturbance ([place])     Plan: The nails were trimmed Q000111Q without complications.  Follow-Up Instructions: Return in about 3 months (around 03/30/2019).   Ortho Exam  Patient is alert, oriented, no adenopathy, well-dressed, normal affect, normal respiratory effort. Examination patient has no infections in either foot no open ulcers.  She has thickened discolored onychomycotic nails x10 she or her family are unable to safely trim the nails on their own due to her advanced dementia.  Nails were trimmed Q000111Q without complications.  Imaging: No results found. No images are attached to the encounter.  Labs: No results found for: HGBA1C, ESRSEDRATE, CRP, LABURIC, REPTSTATUS, GRAMSTAIN, CULT, LABORGA   No results found for: ALBUMIN, PREALBUMIN, LABURIC  No results found for: MG No results found for: VD25OH  No results found for: PREALBUMIN No flowsheet data found.   Body mass index is 15.96 kg/m.  Orders:  No orders of the defined types were placed in this encounter.  No orders of the defined types were placed in this encounter.    Procedures: No procedures performed  Clinical Data: No additional findings.  ROS:  All other systems negative, except as noted in the HPI. Review of Systems  Objective: Vital Signs: Ht 5\' 4"  (1.626 m)   Wt 93 lb (42.2 kg)   BMI 15.96 kg/m    Specialty Comments:  No specialty comments available.  PMFS History: Patient Active Problem List   Diagnosis Date Noted  . Hypothyroidism 02/09/2018  . Benign essential HTN 02/09/2018  . Foot injury, right, sequela 02/09/2018  . Late onset Alzheimer's disease with behavioral disturbance ([place]) 09/20/2016  . Pain due to onychomycosis of nail 06/21/2016   Past Medical History:  Diagnosis Date  . Cancer ([place]) 10/1998   r breast  . Dementia ([place])   . Heart murmur   . History of positive PPD   . Hypertension   . Hypothyroid    s/p radioactive iodine  . Memory loss   . Osteopenia     Family History  Problem Relation Age of Onset  . Cancer Mother     Past Surgical History:  Procedure Laterality Date  . BUNIONECTOMY    . CARPAL TUNNEL RELEASE Right   . FOOT FRACTURE SURGERY    . 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 Smoking status: Never Smoker  . Smokeless tobacco: Never Used  Substance and Sexual Activity  . Alcohol use: No    Alcohol/week: 0.0 standard drinks    Comment: quit 2004  . Drug use: No  . Sexual activity: Not on file

## 2019-04-02 ENCOUNTER — Encounter: Payer: Self-pay | Admitting: Orthopedic Surgery

## 2019-04-02 ENCOUNTER — Ambulatory Visit (INDEPENDENT_AMBULATORY_CARE_PROVIDER_SITE_OTHER): Payer: Medicare HMO | Admitting: Orthopedic Surgery

## 2019-04-02 ENCOUNTER — Other Ambulatory Visit: Payer: Self-pay

## 2019-04-02 VITALS — Ht 64.0 in | Wt 93.0 lb

## 2019-04-02 DIAGNOSIS — B351 Tinea unguium: Secondary | ICD-10-CM

## 2019-04-02 NOTE — Progress Notes (Signed)
Office Visit Note   Patient: Beverly Edwards           Date of Birth: 1945-10-26           MRN: [id number] Visit Date: 04/02/2019              Requested by: Harlan Stains, MD [address] [place],  [place] [address] PCP: Harlan Stains, MD  Chief Complaint  Patient presents with  . Left Foot - Follow-up    Nail trim  . Right Foot - Follow-up    Nail trim      HPI: Patient is a 73 year old woman with advanced Alzheimer's dementia with painful onychomycotic nails x10 the patient and her husband both are unable to safely trim the nails on their own.  Assessment & Plan: Visit Diagnoses:  1. Onychomycosis     Plan: Nails were trimmed Q000111Q without complications.  Reevaluate in 3 months.  Follow-Up Instructions: Return in about 3 months (around 07/03/2019).   Ortho Exam  Patient is alert, oriented, no adenopathy, well-dressed, normal affect, normal respiratory effort. Examination patient has thickened discolored onychomycotic nails x10 she is unable to safely trim the nails on her own her husband cannot trim the nails and the nails were trimmed Q000111Q without complications.  Patient had no paronychial infection is no signs of an ingrown toenail.  Imaging: No results found. No images are attached to the encounter.  Labs: No results found for: HGBA1C, ESRSEDRATE, CRP, LABURIC, REPTSTATUS, GRAMSTAIN, CULT, LABORGA   No results found for: ALBUMIN, PREALBUMIN, LABURIC  No results found for: MG No results found for: VD25OH  No results found for: PREALBUMIN No flowsheet data found.   Body mass index is 15.96 kg/m.  Orders:  No orders of the defined types were placed in this encounter.  No orders of the defined types were placed in this encounter.    Procedures: No procedures performed  Clinical Data: No additional findings.  ROS:  All other systems negative, except as noted in the HPI. Review of Systems  Objective: Vital Signs: Ht 5\' 4"  (1.626  m)   Wt 93 lb (42.2 kg)   BMI 15.96 kg/m   Specialty Comments:  No specialty comments available.  PMFS History: Patient Active Problem List   Diagnosis Date Noted  . Hypothyroidism 02/09/2018  . Benign essential HTN 02/09/2018  . Foot injury, right, sequela 02/09/2018  . Late onset Alzheimer's disease with behavioral disturbance ([place]) 09/20/2016  . Pain due to onychomycosis of nail 06/21/2016   Past Medical History:  Diagnosis Date  . Cancer ([place]) 10/1998   r breast  . Dementia ([place])   . Heart murmur   . History of positive PPD   . Hypertension   . Hypothyroid    s/p radioactive iodine  . Memory loss   . Osteopenia     Family History  Problem Relation Age of Onset  . Cancer Mother     Past Surgical History:  Procedure Laterality Date  . BUNIONECTOMY    . CARPAL TUNNEL RELEASE Right   . FOOT FRACTURE SURGERY    . 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 Smoking status: Never Smoker  . Smokeless tobacco: Never Used  Substance and Sexual Activity  . Alcohol use: No    Alcohol/week: 0.0 standard drinks    Comment: quit 2004  . Drug use: No  . Sexual  activity: Not on file

## 2019-06-07 DIAGNOSIS — E039 Hypothyroidism, unspecified: Secondary | ICD-10-CM | POA: Diagnosis not present

## 2019-06-07 DIAGNOSIS — E440 Moderate protein-calorie malnutrition: Secondary | ICD-10-CM | POA: Diagnosis not present

## 2019-06-07 DIAGNOSIS — R69 Illness, unspecified: Secondary | ICD-10-CM | POA: Diagnosis not present

## 2019-06-07 DIAGNOSIS — Z7189 Other specified counseling: Secondary | ICD-10-CM | POA: Diagnosis not present

## 2019-06-07 DIAGNOSIS — I10 Essential (primary) hypertension: Secondary | ICD-10-CM | POA: Diagnosis not present

## 2019-06-07 DIAGNOSIS — E559 Vitamin D deficiency, unspecified: Secondary | ICD-10-CM | POA: Diagnosis not present

## 2019-07-05 ENCOUNTER — Ambulatory Visit: Payer: Medicare HMO | Admitting: Orthopedic Surgery

## 2019-07-12 ENCOUNTER — Other Ambulatory Visit: Payer: Self-pay

## 2019-07-12 ENCOUNTER — Encounter: Payer: Self-pay | Admitting: Orthopedic Surgery

## 2019-07-12 ENCOUNTER — Ambulatory Visit (INDEPENDENT_AMBULATORY_CARE_PROVIDER_SITE_OTHER): Payer: Medicare HMO | Admitting: Orthopedic Surgery

## 2019-07-12 VITALS — Ht 64.0 in | Wt 93.0 lb

## 2019-07-12 DIAGNOSIS — B351 Tinea unguium: Secondary | ICD-10-CM | POA: Diagnosis not present

## 2019-07-12 DIAGNOSIS — F0281 Dementia in other diseases classified elsewhere with behavioral disturbance: Secondary | ICD-10-CM | POA: Diagnosis not present

## 2019-07-12 DIAGNOSIS — G301 Alzheimer's disease with late onset: Secondary | ICD-10-CM

## 2019-07-12 DIAGNOSIS — F02818 Dementia in other diseases classified elsewhere, unspecified severity, with other behavioral disturbance: Secondary | ICD-10-CM

## 2019-07-12 DIAGNOSIS — R69 Illness, unspecified: Secondary | ICD-10-CM | POA: Diagnosis not present

## 2019-07-16 ENCOUNTER — Encounter: Payer: Self-pay | Admitting: Orthopedic Surgery

## 2019-07-16 NOTE — Progress Notes (Signed)
Office Visit Note   Patient: Beverly Edwards           Date of Birth: 1945-11-18           MRN: [id number] Visit Date: 07/12/2019              Requested by: Harlan Stains, MD [address] [place],  [place] [address] PCP: Harlan Stains, MD  Chief Complaint  Patient presents with  . Right Foot - Nail Problem  . Left Foot - Nail Problem      HPI: Patient is a 74 year old woman who presents in follow-up for onychomycotic nails x10.  With patient's advanced dementia she is unable to trim the nails on her own and family is unable to restrain her to trim the nails either.  Assessment & Plan: Visit Diagnoses:  1. Onychomycosis   2. Late onset Alzheimer's disease with behavioral disturbance ([place])     Plan: Nails were trimmed Q000111Q without complication reevaluate in 3 months.  Follow-Up Instructions: Return in about 3 months (around 10/09/2019).   Ortho Exam  Patient is alert, oriented, no adenopathy, well-dressed, normal affect, normal respiratory effort. Examination patient is agitated.  Examination of both feet there is no ulcers no cellulitis no signs of infection she has thickened discolored onychomycotic nails x10.  After informed consent the nails were trimmed Q000111Q without complications.  Imaging: No results found. No images are attached to the encounter.  Labs: No results found for: HGBA1C, ESRSEDRATE, CRP, LABURIC, REPTSTATUS, GRAMSTAIN, CULT, LABORGA   No results found for: ALBUMIN, PREALBUMIN, LABURIC  No results found for: MG No results found for: VD25OH  No results found for: PREALBUMIN No flowsheet data found.   Body mass index is 15.96 kg/m.  Orders:  No orders of the defined types were placed in this encounter.  No orders of the defined types were placed in this encounter.    Procedures: No procedures performed  Clinical Data: No additional findings.  ROS:  All other systems negative, except as noted in the HPI. Review of  Systems  Objective: Vital Signs: Ht 5\' 4"  (1.626 m)   Wt 93 lb (42.2 kg)   BMI 15.96 kg/m   Specialty Comments:  No specialty comments available.  PMFS History: Patient Active Problem List   Diagnosis Date Noted  . Hypothyroidism 02/09/2018  . Benign essential HTN 02/09/2018  . Foot injury, right, sequela 02/09/2018  . Late onset Alzheimer's disease with behavioral disturbance ([place]) 09/20/2016  . Pain due to onychomycosis of nail 06/21/2016   Past Medical History:  Diagnosis Date  . Cancer ([place]) 10/1998   r breast  . Dementia ([place])   . Heart murmur   . History of positive PPD   . Hypertension   . Hypothyroid    s/p radioactive iodine  . Memory loss   . Osteopenia     Family History  Problem Relation Age of Onset  . Cancer Mother     Past Surgical History:  Procedure Laterality Date  . BUNIONECTOMY    . CARPAL TUNNEL RELEASE Right   . FOOT FRACTURE SURGERY    . 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 Smoking status: Never Smoker  . Smokeless tobacco: Never Used  Substance and Sexual Activity  . Alcohol use: No    Alcohol/week: 0.0 standard drinks    Comment: quit 2004  . Drug use: No  .  Sexual activity: Not on file

## 2019-10-08 ENCOUNTER — Emergency Department (HOSPITAL_COMMUNITY)
Admission: EM | Admit: 2019-10-08 | Discharge: 2019-10-08 | Disposition: A | Discharge status: Home / Self Care | Payer: Medicare HMO | Attending: Emergency Medicine | Admitting: Emergency Medicine

## 2019-10-08 ENCOUNTER — Emergency Department (HOSPITAL_COMMUNITY): Payer: Medicare HMO

## 2019-10-08 ENCOUNTER — Ambulatory Visit: Payer: Medicare HMO | Admitting: Orthopedic Surgery

## 2019-10-08 ENCOUNTER — Encounter (HOSPITAL_COMMUNITY): Payer: Self-pay

## 2019-10-08 DIAGNOSIS — Y999 Unspecified external cause status: Secondary | ICD-10-CM | POA: Diagnosis not present

## 2019-10-08 DIAGNOSIS — S8992XA Unspecified injury of left lower leg, initial encounter: Secondary | ICD-10-CM | POA: Diagnosis not present

## 2019-10-08 DIAGNOSIS — Y939 Activity, unspecified: Secondary | ICD-10-CM | POA: Insufficient documentation

## 2019-10-08 DIAGNOSIS — R52 Pain, unspecified: Secondary | ICD-10-CM | POA: Diagnosis not present

## 2019-10-08 DIAGNOSIS — R400 Somnolence: Secondary | ICD-10-CM | POA: Insufficient documentation

## 2019-10-08 DIAGNOSIS — W19XXXA Unspecified fall, initial encounter: Secondary | ICD-10-CM | POA: Insufficient documentation

## 2019-10-08 DIAGNOSIS — M79605 Pain in left leg: Secondary | ICD-10-CM | POA: Diagnosis not present

## 2019-10-08 DIAGNOSIS — Z79899 Other long term (current) drug therapy: Secondary | ICD-10-CM | POA: Diagnosis not present

## 2019-10-08 DIAGNOSIS — S93402A Sprain of unspecified ligament of left ankle, initial encounter: Secondary | ICD-10-CM

## 2019-10-08 DIAGNOSIS — R413 Other amnesia: Secondary | ICD-10-CM | POA: Diagnosis not present

## 2019-10-08 DIAGNOSIS — M50322 Other cervical disc degeneration at C5-C6 level: Secondary | ICD-10-CM | POA: Diagnosis not present

## 2019-10-08 DIAGNOSIS — E039 Hypothyroidism, unspecified: Secondary | ICD-10-CM | POA: Diagnosis not present

## 2019-10-08 DIAGNOSIS — S99922A Unspecified injury of left foot, initial encounter: Secondary | ICD-10-CM | POA: Diagnosis not present

## 2019-10-08 DIAGNOSIS — S0990XA Unspecified injury of head, initial encounter: Secondary | ICD-10-CM | POA: Insufficient documentation

## 2019-10-08 DIAGNOSIS — F0390 Unspecified dementia without behavioral disturbance: Secondary | ICD-10-CM | POA: Insufficient documentation

## 2019-10-08 DIAGNOSIS — Z853 Personal history of malignant neoplasm of breast: Secondary | ICD-10-CM | POA: Insufficient documentation

## 2019-10-08 DIAGNOSIS — R69 Illness, unspecified: Secondary | ICD-10-CM | POA: Diagnosis not present

## 2019-10-08 DIAGNOSIS — M50323 Other cervical disc degeneration at C6-C7 level: Secondary | ICD-10-CM | POA: Diagnosis not present

## 2019-10-08 DIAGNOSIS — M50321 Other cervical disc degeneration at C4-C5 level: Secondary | ICD-10-CM | POA: Diagnosis not present

## 2019-10-08 DIAGNOSIS — Y929 Unspecified place or not applicable: Secondary | ICD-10-CM | POA: Diagnosis not present

## 2019-10-08 DIAGNOSIS — R0902 Hypoxemia: Secondary | ICD-10-CM | POA: Diagnosis not present

## 2019-10-08 DIAGNOSIS — I10 Essential (primary) hypertension: Secondary | ICD-10-CM | POA: Diagnosis not present

## 2019-10-08 DIAGNOSIS — S99912A Unspecified injury of left ankle, initial encounter: Secondary | ICD-10-CM | POA: Diagnosis present

## 2019-10-08 LAB — CBC WITH DIFFERENTIAL/PLATELET
Abs Immature Granulocytes: 0.03 10*3/uL (ref 0.00–0.07)
Basophils Absolute: 0 10*3/uL (ref 0.0–0.1)
Basophils Relative: 0 %
Eosinophils Absolute: 0 10*3/uL (ref 0.0–0.5)
Eosinophils Relative: 0 %
HCT: 43.7 % (ref 36.0–46.0)
Hemoglobin: 14 g/dL (ref 12.0–15.0)
Immature Granulocytes: 0 %
Lymphocytes Relative: 11 %
Lymphs Abs: 1.1 10*3/uL (ref 0.7–4.0)
MCH: 29.7 pg (ref 26.0–34.0)
MCHC: 32 g/dL (ref 30.0–36.0)
MCV: 92.8 fL (ref 80.0–100.0)
Monocytes Absolute: 0.7 10*3/uL (ref 0.1–1.0)
Monocytes Relative: 7 %
Neutro Abs: 8.3 10*3/uL — ABNORMAL HIGH (ref 1.7–7.7)
Neutrophils Relative %: 82 %
Platelets: 212 10*3/uL (ref 150–400)
RBC: 4.71 MIL/uL (ref 3.87–5.11)
RDW: 13 % (ref 11.5–15.5)
WBC: 10.2 10*3/uL (ref 4.0–10.5)
nRBC: 0 % (ref 0.0–0.2)

## 2019-10-08 LAB — BASIC METABOLIC PANEL
Anion gap: 10 (ref 5–15)
BUN: 18 mg/dL (ref 8–23)
CO2: 29 mmol/L (ref 22–32)
Calcium: 9.7 mg/dL (ref 8.9–10.3)
Chloride: 105 mmol/L (ref 98–111)
Creatinine, Ser: 1.03 mg/dL — ABNORMAL HIGH (ref 0.44–1.00)
GFR calc Af Amer: >60 mL/min (ref >60)
GFR calc non Af Amer: 54 mL/min — ABNORMAL LOW (ref >60)
Glucose, Bld: 126 mg/dL — ABNORMAL HIGH (ref 70–99)
Potassium: 4.7 mmol/L (ref 3.5–5.1)
Sodium: 144 mmol/L (ref 135–145)

## 2019-10-08 NOTE — ED Triage Notes (Signed)
Pt presents via EMS from home with c/o fall that occurred last night. Pt does have dementia, had a fall at home last night. Pt is nonverbal at baseline but family reported that she is grimacing when they touch her left lower leg towards pt's ankle. Pt does have a hematoma to her forehead from hitting a bump in the ambulance on the way here.

## 2019-10-08 NOTE — Discharge Instructions (Addendum)
Weight bearing as tolerated.    Follow up with primary doctor if symptoms are not improving in the next few days.

## 2019-10-08 NOTE — ED Provider Notes (Signed)
[facility] DEPT Provider Note   CSN: [id number] Arrival date & time: 10/08/19  1217     History Chief Complaint  Patient presents with  . Fall    Beverly Edwards is a 74 y.o. female.  Patient is a 74 year old female with past medical history of hypertension, advanced dementia, right breast cancer.  She presents today for evaluation of fall.  From what I am told patient fell yesterday evening and has been complaining of pain in her left lower leg.  I have also told she bumped her head in route to the ER.  Patient has little additional history secondary to dementia.  The history is provided by the patient.  Fall This is a new problem. The current episode started yesterday. The problem occurs constantly. The problem has not changed since onset.Nothing aggravates the symptoms. Nothing relieves the symptoms. She has tried nothing for the symptoms.       Past Medical History:  Diagnosis Date  . Cancer ([place]) 10/1998   r breast  . Dementia ([place])   . Heart murmur   . History of positive PPD   . Hypertension   . Hypothyroid    s/p radioactive iodine  . Memory loss   . Osteopenia     Patient Active Problem List   Diagnosis Date Noted  . Hypothyroidism 02/09/2018  . Benign essential HTN 02/09/2018  . Foot injury, right, sequela 02/09/2018  . Late onset Alzheimer's disease with behavioral disturbance ([place]) 09/20/2016  . Pain due to onychomycosis of nail 06/21/2016    Past Surgical History:  Procedure Laterality Date  . BUNIONECTOMY    . CARPAL TUNNEL RELEASE Right   . FOOT FRACTURE SURGERY    . MYOMECTOMY       OB History   No obstetric history on file.     Family History  Problem Relation Age of Onset  . Cancer Mother     Social History   Tobacco Use  . Smoking status: Never Smoker  . Smokeless tobacco: Never Used  Substance Use Topics  . Alcohol use: No    Alcohol/week: 0.0 standard drinks    Comment: quit 2004  . Drug use:  No    Home Medications Prior to Admission medications   Medication Sig Start Date End Date Taking? Authorizing Provider  donepezil (ARICEPT) 10 MG tablet Take 1 tablet (10 mg total) by mouth at bedtime. 05/03/16   Penumalli, Earlean Polka, MD  levothyroxine (SYNTHROID, LEVOTHROID) 75 MCG tablet Take 75 mcg by mouth daily. 11/20/17   [provider]  lisinopril (PRINIVIL,ZESTRIL) 10 MG tablet Take 10 mg by mouth daily. 01/10/18   [provider]    Allergies    Patient has no known allergies.  Review of Systems   Review of Systems  Unable to perform ROS: Dementia    Physical Exam Updated Vital Signs BP 113/74   Pulse (!) 59   Temp 98.1 F (36.7 C) (Oral)   Resp 12   SpO2 100%   Physical Exam Vitals and nursing note reviewed.  Constitutional:      General: She is not in acute distress.    Appearance: She is well-developed. She is not diaphoretic.     Comments: Patient is a 74 year old female who appears in no acute distress.  She is disoriented and confused.  She is somnolent, but arousable.  HENT:     Head: Normocephalic and atraumatic.  Eyes:     Extraocular Movements: Extraocular movements intact.  Pupils: Pupils are equal, round, and reactive to light.  Cardiovascular:     Rate and Rhythm: Normal rate and regular rhythm.     Heart sounds: No murmur. No friction rub. No gallop.   Pulmonary:     Effort: Pulmonary effort is normal. No respiratory distress.     Breath sounds: Normal breath sounds. No wheezing.  Abdominal:     General: Bowel sounds are normal. There is no distension.     Palpations: Abdomen is soft.     Tenderness: There is no abdominal tenderness.  Musculoskeletal:        General: Normal range of motion.     Cervical back: Normal range of motion and neck supple.  Skin:    General: Skin is warm and dry.  Neurological:     Mental Status: She is alert.     Comments: Patient is somnolent, but arousable.  She moves all extremities.   Neurologic exam is difficult secondary to her advanced dementia and inability to follow commands.     ED Results / Procedures / Treatments   Labs (all labs ordered are listed, but only abnormal results are displayed) Labs Reviewed  BASIC METABOLIC PANEL  CBC WITH DIFFERENTIAL/PLATELET  URINALYSIS, ROUTINE W REFLEX MICROSCOPIC    EKG None  Radiology No results found.  Procedures Procedures (including critical care time)  Medications Ordered in ED Medications - No data to display  ED Course  I have reviewed the triage vital signs and the nursing notes.  Pertinent labs & imaging results that were available during my care of the patient were reviewed by me and considered in my medical decision making (see chart for details).    MDM Rules/Calculators/A&P  Patient is a 74 year old female presenting with complaints of fall last night with left leg pain.  Labs are unremarkable and xrays/CT negative.  Patient to be discharged, to return prn.    Final Clinical Impression(s) / ED Diagnoses Final diagnoses:  None    Rx / DC Orders ED Discharge Orders    None       Veryl Speak, MD 10/08/19 1540

## 2019-11-01 ENCOUNTER — Encounter: Payer: Self-pay | Admitting: Orthopedic Surgery

## 2019-11-01 ENCOUNTER — Other Ambulatory Visit: Payer: Self-pay

## 2019-11-01 ENCOUNTER — Ambulatory Visit (INDEPENDENT_AMBULATORY_CARE_PROVIDER_SITE_OTHER): Payer: Medicare HMO | Admitting: Orthopedic Surgery

## 2019-11-01 VITALS — Ht 64.0 in | Wt 93.0 lb

## 2019-11-01 DIAGNOSIS — B351 Tinea unguium: Secondary | ICD-10-CM

## 2019-11-01 NOTE — Progress Notes (Signed)
Office Visit Note   Patient: Beverly Edwards           Date of Birth: [birth year]           MRN: [id number] Visit Date: 11/01/2019              Requested by: Harlan Stains, MD [address] [place],  [place] [address] PCP: Harlan Stains, MD  Chief Complaint  Patient presents with  . Left Foot - Follow-up  . Right Foot - Follow-up      HPI: Patient is a 74 year old woman with Alzheimer's dementia her dementia is becoming more advanced.  She has thickened discolored onychomycotic nails she is unable to safely trim the nails on her own family is unable to trim the nails due to her agitation and she is seen today in follow-up.  Assessment & Plan: Visit Diagnoses:  1. Onychomycosis     Plan: Nails were trimmed Y40 without complications.  Follow-Up Instructions: Return in about 3 months (around 02/01/2020).   Ortho Exam  Patient is alert, oriented, no adenopathy, well-dressed, normal affect, normal respiratory effort. Examination of both legs there is no redness no cellulitis no open ulcers.  Patient has thickened discolored onychomycotic nails x10 after informed consent the nails were trimmed H47 without complications.  Patient's husband was with her he has no concerns or questions at this time.  Imaging: No results found. No images are attached to the encounter.  Labs: No results found for: HGBA1C, ESRSEDRATE, CRP, LABURIC, REPTSTATUS, GRAMSTAIN, CULT, LABORGA   No results found for: ALBUMIN, PREALBUMIN, LABURIC  No results found for: MG No results found for: VD25OH  No results found for: PREALBUMIN CBC EXTENDED Latest Ref Rng & Units 10/08/2019  WBC 4.0 - 10.5 K/uL 10.2  RBC 3.87 - 5.11 MIL/uL 4.71  HGB 12.0 - 15.0 g/dL 14.0  HCT 36 - 46 % 43.7  PLT 150 - 400 K/uL 212  NEUTROABS 1.7 - 7.7 K/uL 8.3(H)  LYMPHSABS 0.7 - 4.0 K/uL 1.1     Body mass index is 15.96 kg/m.  Orders:  No orders of the defined types were placed in this encounter.  No  orders of the defined types were placed in this encounter.    Procedures: No procedures performed  Clinical Data: No additional findings.  ROS:  All other systems negative, except as noted in the HPI. Review of Systems  Objective: Vital Signs: Ht 5\' 4"  (1.626 m)   Wt 93 lb (42.2 kg)   BMI 15.96 kg/m   Specialty Comments:  No specialty comments available.  PMFS History: Patient Active Problem List   Diagnosis Date Noted  . Hypothyroidism 02/09/2018  . Benign essential HTN 02/09/2018  . Foot injury, right, sequela 02/09/2018  . Late onset Alzheimer's disease with behavioral disturbance ([place]) 09/20/2016  . Pain due to onychomycosis of nail 06/21/2016   Past Medical History:  Diagnosis Date  . Cancer ([place]) 10/1998   r breast  . Dementia ([place])   . Heart murmur   . History of positive PPD   . Hypertension   . Hypothyroid    s/p radioactive iodine  . Memory loss   . Osteopenia     Family History  Problem Relation Age of Onset  . Cancer Mother     Past Surgical History:  Procedure Laterality Date  . BUNIONECTOMY    . CARPAL TUNNEL RELEASE Right   . FOOT FRACTURE SURGERY    . 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 Smoking status: Never Smoker  . Smokeless tobacco: Never Used  Substance and Sexual Activity  . Alcohol use: No    Alcohol/week: 0.0 standard drinks    Comment: quit 2004  . Drug use: No  . Sexual activity: Not on file

## 2019-11-07 DIAGNOSIS — R32 Unspecified urinary incontinence: Secondary | ICD-10-CM | POA: Diagnosis not present

## 2019-11-07 DIAGNOSIS — R69 Illness, unspecified: Secondary | ICD-10-CM | POA: Diagnosis not present

## 2019-11-07 DIAGNOSIS — E039 Hypothyroidism, unspecified: Secondary | ICD-10-CM | POA: Diagnosis not present

## 2019-11-07 DIAGNOSIS — Z7409 Other reduced mobility: Secondary | ICD-10-CM | POA: Diagnosis not present

## 2019-11-07 DIAGNOSIS — I10 Essential (primary) hypertension: Secondary | ICD-10-CM | POA: Diagnosis not present

## 2019-11-07 DIAGNOSIS — G309 Alzheimer's disease, unspecified: Secondary | ICD-10-CM | POA: Diagnosis not present

## 2019-12-12 DIAGNOSIS — R69 Illness, unspecified: Secondary | ICD-10-CM | POA: Diagnosis not present

## 2019-12-12 DIAGNOSIS — E440 Moderate protein-calorie malnutrition: Secondary | ICD-10-CM | POA: Diagnosis not present

## 2019-12-12 DIAGNOSIS — E039 Hypothyroidism, unspecified: Secondary | ICD-10-CM | POA: Diagnosis not present

## 2019-12-12 DIAGNOSIS — I10 Essential (primary) hypertension: Secondary | ICD-10-CM | POA: Diagnosis not present

## 2019-12-25 ENCOUNTER — Telehealth: Payer: Self-pay | Admitting: Hospice

## 2019-12-25 NOTE — Telephone Encounter (Signed)
Spoke with patient's husband, Nelly Rout, regarding Palliative services and he was in agreement with this.  I have scheduled an In-person Consult for 01/10/20 @ 9:30 AM.

## 2020-01-10 ENCOUNTER — Other Ambulatory Visit: Payer: Medicare HMO | Admitting: Hospice

## 2020-01-10 ENCOUNTER — Other Ambulatory Visit: Payer: Self-pay

## 2020-01-10 DIAGNOSIS — Z515 Encounter for palliative care: Secondary | ICD-10-CM

## 2020-01-10 DIAGNOSIS — F0390 Unspecified dementia without behavioral disturbance: Secondary | ICD-10-CM

## 2020-01-10 NOTE — Progress Notes (Signed)
[facility] Pall[facility] Consult Note Telephone: [phone]  Fax: [phone]  PATIENT NAME: Beverly Edwards DOB: 27-Mar-1946 MRN: [id number]  PRIMARY CARE PROVIDER:   Harlan Stains, MD  REFERRING PROVIDER: Harlan Stains, MD  RESPONSIBLE PARTY:  Spouse Beverly Edwards [phone] best to call Beverly Edwards - Daughter [phone]    RECOMMENDATIONS/PLAN:   Advance Care Planning: Visit at the request of Dr.  Harlan Stains for palliative consult. Visit consisted of building trust and discussions on Palliative Medicine as specialized medical care for people living with serious illness, aimed at facilitating better quality of life through symptoms relief, assisting with advance care plan and establishing goals of care.  Beverly Edwards and Beverly Edwards present with patient during visit; verbalized understanding and endorsed service.   Visit also consisted of counseling and education dealing with the complex and emotionally intense issues of symptom management and palliative care in the setting of serious and potentially life-threatening illness.  Their Beverly Edwards Beverly Edwards and participation in church activities constitute their support/coping mechanism.  Palliative care team will continue to support patient, patient's family, and medical team. Code Status: After extensive discussions on ramifications and implications of CODE STATUS, spouse elected DO NOT RESUSCITATE for patient.  DNR form signed today and left at home with patient; same document uploaded to epic. Goals of Care: Goals of care include to maximize quality of life and symptom management.  Family is open to hospice service at home, in the future when patient qualifies for it. Follow up: Palliative care will continue to follow patient for goals of care clarification and symptom management.  Beverly Lex, NP will call and follow up with patient Symptom management: Patient with advanced Dementia requiring cueing in ADLs; feeds  self after set up. She is incontinent of bowel and bladder FAST 7a, only mumbling without coherent speech. FLACC 0.  No Fall since May '21, no hospitalization. Appetite is good; no difficulty swallowing food. She is compliant with her medications: Aricept, Lisinopril and Synthroid; no adverse reactions.  Patient in no medical acuity; family with no complaints or concerns at this time.  Encouraged ongoing care.  I spent 1 hour and 16 minutes providing this initial consultation; time iincludes time spent with patient/family, chart review, provider coordination,  and documentation. More than 50% of the time in this consultation was spent on coordinating communication  HISTORY OF PRESENT ILLNESS:  Beverly Edwards is a 74 y.o. year old female with multiple medical problems including advanced dementia, hypertension, history of left breast cancer. Palliative Care was asked to help address goals of care.   CODE STATUS: DO NOT RESUSCITATE  PPS: 50% HOSPICE ELIGIBILITY/DIAGNOSIS: TBD  PAST MEDICAL HISTORY:  Past Medical History:  Diagnosis Date  . Cancer ([place]) 10/1998   r breast  . Dementia ([place])   . Heart murmur   . History of positive PPD   . Hypertension   . Hypothyroid    s/p radioactive iodine  . Memory loss   . Osteopenia     SOCIAL HX:  Social History   Tobacco Use  . Smoking status: Never Smoker  . Smokeless tobacco: Never Used  Substance Use Topics  . Alcohol use: No    Alcohol/week: 0.0 standard drinks    Comment: quit 2004    ALLERGIES: No Known Allergies   PERTINENT MEDICATIONS:  Outpatient Encounter Medications as of 01/10/2020  Medication Sig  . donepezil (ARICEPT) 10 MG tablet Take 1 tablet (10 mg total) by mouth at  bedtime.  Marland Kitchen levothyroxine (SYNTHROID, LEVOTHROID) 75 MCG tablet Take 75 mcg by mouth daily.  Marland Kitchen lisinopril (ZESTRIL) 5 MG tablet Take 5 mg by mouth daily.   No facility-administered encounter medications on file as of 01/10/2020.    PHYSICAL  EXAM/ROS:  General: NAD, frail appearing, thin Cardiovascular: regular rate and rhythm Pulmonary: clear ant/post fields Abdomen: soft, nontender, + bowel sounds; no report of constipation GU: no suprapubic tenderness Extremities: no edema, no joint deformities; thickened toenails, hair loss Skin: no rashes to exposed skin Neurological: Weakness but otherwise nonfocal  Beverly Spray, NP

## 2020-02-04 ENCOUNTER — Ambulatory Visit (INDEPENDENT_AMBULATORY_CARE_PROVIDER_SITE_OTHER): Payer: Medicare HMO | Admitting: Orthopedic Surgery

## 2020-02-04 ENCOUNTER — Encounter: Payer: Self-pay | Admitting: Orthopedic Surgery

## 2020-02-04 VITALS — Ht 64.0 in | Wt 93.0 lb

## 2020-02-04 DIAGNOSIS — R69 Illness, unspecified: Secondary | ICD-10-CM | POA: Diagnosis not present

## 2020-02-04 DIAGNOSIS — F0281 Dementia in other diseases classified elsewhere with behavioral disturbance: Secondary | ICD-10-CM | POA: Diagnosis not present

## 2020-02-04 DIAGNOSIS — G301 Alzheimer's disease with late onset: Secondary | ICD-10-CM

## 2020-02-04 DIAGNOSIS — B351 Tinea unguium: Secondary | ICD-10-CM | POA: Diagnosis not present

## 2020-02-04 DIAGNOSIS — F02818 Dementia in other diseases classified elsewhere, unspecified severity, with other behavioral disturbance: Secondary | ICD-10-CM

## 2020-02-04 NOTE — Progress Notes (Signed)
Office Visit Note   Patient: Beverly Edwards           Date of Birth: 1945/11/28           MRN: [id number] Visit Date: 02/04/2020              Requested by: Harlan Stains, MD [place] [address] [place],  [place] [address] PCP: Harlan Stains, MD  Chief Complaint  Patient presents with  . Right Foot - Follow-up    Here for nail trim  . Left Foot - Follow-up    Here for nail trim      HPI: Patient is a 74 year old woman with Alzheimer's dementia who is combative.  She is unable to safely trim the nails on her own and she presents at this time for evaluation of her onychomycosis.  Assessment & Plan: Visit Diagnoses:  1. Onychomycosis   2. Late onset Alzheimer's disease with behavioral disturbance ([place])     Plan: Nails were trimmed M46 without complications.  Follow-Up Instructions: Return in about 3 months (around 05/05/2020).   Ortho Exam  Patient is alert, oriented, no adenopathy, well-dressed, normal affect, normal respiratory effort. Examination patient has no ulcers on either foot there is no evidence of the nail infection she has thickened discolored onychomycotic nails x10 with patient's advanced Alzheimer's dementia she is unable to safely trim the nails on her own.  After informed consent with her husband the nails were trimmed O03 without complications.  Imaging: No results found. No images are attached to the encounter.  Labs: No results found for: HGBA1C, ESRSEDRATE, CRP, LABURIC, REPTSTATUS, GRAMSTAIN, CULT, LABORGA   No results found for: ALBUMIN, PREALBUMIN, LABURIC  No results found for: MG No results found for: VD25OH  No results found for: PREALBUMIN CBC EXTENDED Latest Ref Rng & Units 10/08/2019  WBC 4.0 - 10.5 K/uL 10.2  RBC 3.87 - 5.11 MIL/uL 4.71  HGB 12.0 - 15.0 g/dL 14.0  HCT 36 - 46 % 43.7  PLT 150 - 400 K/uL 212  NEUTROABS 1.7 - 7.7 K/uL 8.3(H)  LYMPHSABS 0.7 - 4.0 K/uL 1.1     Body mass index is 15.96 kg/m.  Orders:   No orders of the defined types were placed in this encounter.  No orders of the defined types were placed in this encounter.    Procedures: No procedures performed  Clinical Data: No additional findings.  ROS:  All other systems negative, except as noted in the HPI. Review of Systems  Objective: Vital Signs: Ht 5\' 4"  (1.626 m)   Wt 93 lb (42.2 kg)   BMI 15.96 kg/m   Specialty Comments:  No specialty comments available.  PMFS History: Patient Active Problem List   Diagnosis Date Noted  . Hypothyroidism 02/09/2018  . Benign essential HTN 02/09/2018  . Foot injury, right, sequela 02/09/2018  . Late onset Alzheimer's disease with behavioral disturbance ([place]) 09/20/2016  . Pain due to onychomycosis of nail 06/21/2016   Past Medical History:  Diagnosis Date  . Cancer ([place]) 10/1998   r breast  . Dementia ([place])   . Heart murmur   . History of positive PPD   . Hypertension   . Hypothyroid    s/p radioactive iodine  . Memory loss   . Osteopenia     Family History  Problem Relation Age of Onset  . Cancer Mother     Past Surgical History:  Procedure Laterality Date  . BUNIONECTOMY    . CARPAL TUNNEL  RELEASE Right   . FOOT FRACTURE SURGERY    . 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 Smoking status: Never Smoker  . Smokeless tobacco: Never Used  Substance and Sexual Activity  . Alcohol use: No    Alcohol/week: 0.0 standard drinks    Comment: quit 2004  . Drug use: No  . Sexual activity: Not on file

## 2020-05-05 ENCOUNTER — Ambulatory Visit (INDEPENDENT_AMBULATORY_CARE_PROVIDER_SITE_OTHER): Payer: Medicare HMO | Admitting: Orthopedic Surgery

## 2020-05-05 ENCOUNTER — Encounter: Payer: Self-pay | Admitting: Orthopedic Surgery

## 2020-05-05 DIAGNOSIS — R69 Illness, unspecified: Secondary | ICD-10-CM | POA: Diagnosis not present

## 2020-05-05 DIAGNOSIS — F0281 Dementia in other diseases classified elsewhere with behavioral disturbance: Secondary | ICD-10-CM | POA: Diagnosis not present

## 2020-05-05 DIAGNOSIS — F02818 Dementia in other diseases classified elsewhere, unspecified severity, with other behavioral disturbance: Secondary | ICD-10-CM

## 2020-05-05 DIAGNOSIS — G301 Alzheimer's disease with late onset: Secondary | ICD-10-CM

## 2020-05-05 DIAGNOSIS — B351 Tinea unguium: Secondary | ICD-10-CM | POA: Diagnosis not present

## 2020-05-05 NOTE — Progress Notes (Signed)
Office Visit Note   Patient: Beverly Edwards           Date of Birth: 12/13/1945           MRN: [id number] Visit Date: 05/05/2020              Requested by: Harlan Stains, MD [address] [place],  [place] [address] PCP: Harlan Stains, MD  Chief Complaint  Patient presents with  . Left Foot - Follow-up  . Right Foot - Follow-up      HPI: Patient is a 74 year old woman with dementia with onychomycotic nails x10.  Assessment & Plan: Visit Diagnoses:  1. Onychomycosis   2. Late onset Alzheimer's disease with behavioral disturbance ([place])     Plan: Nails were trimmed V56 without complications.  Follow-Up Instructions: Return in about 3 months (around 08/03/2020).   Ortho Exam  Patient is alert, oriented, no adenopathy, well-dressed, normal affect, normal respiratory effort. Examination patient has thickened discolored onychomycotic nails x10 there is no paronychial infection no cellulitis in the foot no open ulcers.  Patient is unable to safely trim her nails on her own the family is unable to safely trim the nails and patient's nails were trimmed E33 without complications.  Imaging: No results found. No images are attached to the encounter.  Labs: No results found for: HGBA1C, ESRSEDRATE, CRP, LABURIC, REPTSTATUS, GRAMSTAIN, CULT, LABORGA   No results found for: ALBUMIN, PREALBUMIN, LABURIC  No results found for: MG No results found for: VD25OH  No results found for: PREALBUMIN CBC EXTENDED Latest Ref Rng & Units 10/08/2019  WBC 4.0 - 10.5 K/uL 10.2  RBC 3.87 - 5.11 MIL/uL 4.71  HGB 12.0 - 15.0 g/dL 14.0  HCT 36.0 - 46.0 % 43.7  PLT 150 - 400 K/uL 212  NEUTROABS 1.7 - 7.7 K/uL 8.3(H)  LYMPHSABS 0.7 - 4.0 K/uL 1.1     There is no height or weight on file to calculate BMI.  Orders:  No orders of the defined types were placed in this encounter.  No orders of the defined types were placed in this encounter.    Procedures: No procedures  performed  Clinical Data: No additional findings.  ROS:  All other systems negative, except as noted in the HPI. Review of Systems  Objective: Vital Signs: There were no vitals taken for this visit.  Specialty Comments:  No specialty comments available.  PMFS History: Patient Active Problem List   Diagnosis Date Noted  . Hypothyroidism 02/09/2018  . Benign essential HTN 02/09/2018  . Foot injury, right, sequela 02/09/2018  . Late onset Alzheimer's disease with behavioral disturbance ([place]) 09/20/2016  . Pain due to onychomycosis of nail 06/21/2016   Past Medical History:  Diagnosis Date  . Cancer ([place]) 10/1998   r breast  . Dementia ([place])   . Heart murmur   . History of positive PPD   . Hypertension   . Hypothyroid    s/p radioactive iodine  . Memory loss   . Osteopenia     Family History  Problem Relation Age of Onset  . Cancer Mother     Past Surgical History:  Procedure Laterality Date  . BUNIONECTOMY    . CARPAL TUNNEL RELEASE Right   . FOOT FRACTURE SURGERY    . 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 Smoking status: Never Smoker  . Smokeless tobacco: Never Used  Substance and Sexual Activity  . Alcohol use: No    Alcohol/week: 0.0 standard drinks    Comment: quit 2004  . Drug use: No  . Sexual activity: Not on file

## 2020-05-28 ENCOUNTER — Telehealth: Payer: Self-pay

## 2020-05-28 NOTE — Telephone Encounter (Signed)
Volunteer called patient on behalf of Palliative Care. Patient is doing well at this time.  

## 2020-06-13 DIAGNOSIS — R54 Age-related physical debility: Secondary | ICD-10-CM | POA: Diagnosis not present

## 2020-06-13 DIAGNOSIS — E43 Unspecified severe protein-calorie malnutrition: Secondary | ICD-10-CM | POA: Diagnosis not present

## 2020-06-13 DIAGNOSIS — I10 Essential (primary) hypertension: Secondary | ICD-10-CM | POA: Diagnosis not present

## 2020-06-13 DIAGNOSIS — E039 Hypothyroidism, unspecified: Secondary | ICD-10-CM | POA: Diagnosis not present

## 2020-06-13 DIAGNOSIS — R69 Illness, unspecified: Secondary | ICD-10-CM | POA: Diagnosis not present

## 2020-07-31 DIAGNOSIS — Z853 Personal history of malignant neoplasm of breast: Secondary | ICD-10-CM | POA: Diagnosis not present

## 2020-07-31 DIAGNOSIS — I10 Essential (primary) hypertension: Secondary | ICD-10-CM | POA: Diagnosis not present

## 2020-07-31 DIAGNOSIS — Z8249 Family history of ischemic heart disease and other diseases of the circulatory system: Secondary | ICD-10-CM | POA: Diagnosis not present

## 2020-07-31 DIAGNOSIS — R636 Underweight: Secondary | ICD-10-CM | POA: Diagnosis not present

## 2020-07-31 DIAGNOSIS — R32 Unspecified urinary incontinence: Secondary | ICD-10-CM | POA: Diagnosis not present

## 2020-07-31 DIAGNOSIS — I739 Peripheral vascular disease, unspecified: Secondary | ICD-10-CM | POA: Diagnosis not present

## 2020-07-31 DIAGNOSIS — R69 Illness, unspecified: Secondary | ICD-10-CM | POA: Diagnosis not present

## 2020-07-31 DIAGNOSIS — E039 Hypothyroidism, unspecified: Secondary | ICD-10-CM | POA: Diagnosis not present

## 2020-07-31 DIAGNOSIS — G309 Alzheimer's disease, unspecified: Secondary | ICD-10-CM | POA: Diagnosis not present

## 2020-07-31 DIAGNOSIS — Z008 Encounter for other general examination: Secondary | ICD-10-CM | POA: Diagnosis not present

## 2020-07-31 DIAGNOSIS — Z681 Body mass index (BMI) 19 or less, adult: Secondary | ICD-10-CM | POA: Diagnosis not present

## 2020-08-04 ENCOUNTER — Ambulatory Visit (INDEPENDENT_AMBULATORY_CARE_PROVIDER_SITE_OTHER): Payer: Medicare HMO | Admitting: Orthopedic Surgery

## 2020-08-04 ENCOUNTER — Encounter: Payer: Self-pay | Admitting: Orthopedic Surgery

## 2020-08-04 DIAGNOSIS — G301 Alzheimer's disease with late onset: Secondary | ICD-10-CM

## 2020-08-04 DIAGNOSIS — B351 Tinea unguium: Secondary | ICD-10-CM | POA: Diagnosis not present

## 2020-08-04 DIAGNOSIS — M25571 Pain in right ankle and joints of right foot: Secondary | ICD-10-CM

## 2020-08-04 DIAGNOSIS — F0281 Dementia in other diseases classified elsewhere with behavioral disturbance: Secondary | ICD-10-CM

## 2020-08-04 DIAGNOSIS — R69 Illness, unspecified: Secondary | ICD-10-CM | POA: Diagnosis not present

## 2020-08-04 DIAGNOSIS — M25572 Pain in left ankle and joints of left foot: Secondary | ICD-10-CM | POA: Diagnosis not present

## 2020-08-04 NOTE — Progress Notes (Signed)
Office Visit Note   Patient: Beverly Edwards           Date of Birth: 09-09-1945           MRN: [id number] Visit Date: 08/04/2020              Requested by: Harlan Stains, MD [address],  [place] [address] PCP: Harlan Stains, MD  Chief Complaint  Patient presents with  . Left Foot - Follow-up    NAIL TRIM  . Right Foot - Follow-up    NAIL TRIM      HPI: Patient is a 75 year old woman who is seen in follow-up for both feet  Assessment & Plan: Visit Diagnoses:  1. Onychomycosis   2. Late onset Alzheimer's disease with behavioral disturbance ([facility])     Plan: Nails were trimmed Q65 complications no ulcers no paronychial infections.  Follow-Up Instructions: Return in about 3 months (around 11/04/2020).   Ortho Exam  Patient is alert, oriented, no adenopathy, well-dressed, normal affect, normal respiratory effort. Examination of both feet there are no plantar ulcers or calluses.  There is no paronychial infection she has thickened discolored onychomycotic nails x10 she is unable to safely trim the nails on her own due to her Alzheimer's dementia and the nails were trimmed H84 without complications.  Imaging: No results found. No images are attached to the encounter.  Labs: No results found for: HGBA1C, ESRSEDRATE, CRP, LABURIC, REPTSTATUS, GRAMSTAIN, CULT, LABORGA   No results found for: ALBUMIN, PREALBUMIN, CBC  No results found for: MG No results found for: VD25OH  No results found for: PREALBUMIN CBC EXTENDED Latest Ref Rng & Units 10/08/2019  WBC 4.0 - 10.5 K/uL 10.2  RBC 3.87 - 5.11 MIL/uL 4.71  HGB 12.0 - 15.0 g/dL 14.0  HCT 36.0 - 46.0 % 43.7  PLT 150 - 400 K/uL 212  NEUTROABS 1.7 - 7.7 K/uL 8.3(H)  LYMPHSABS 0.7 - 4.0 K/uL 1.1     There is no height or weight on file to calculate BMI.  Orders:  No orders of the defined types were placed in this encounter.  No orders of the defined types were placed in this encounter.     Procedures: No procedures performed  Clinical Data: No additional findings.  ROS:  All other systems negative, except as noted in the HPI. Review of Systems  Objective: Vital Signs: There were no vitals taken for this visit.  Specialty Comments:  No specialty comments available.  PMFS History: Patient Active Problem List   Diagnosis Date Noted  . Hypothyroidism 02/09/2018  . Benign essential HTN 02/09/2018  . Foot injury, right, sequela 02/09/2018  . Late onset Alzheimer's disease with behavioral disturbance ([place]) 09/20/2016  . Pain due to onychomycosis of nail 06/21/2016   Past Medical History:  Diagnosis Date  . Cancer ([place]) 10/1998   r breast  . Dementia ([place])   . Heart murmur   . History of positive PPD   . Hypertension   . Hypothyroid    s/p radioactive iodine  . Memory loss   . Osteopenia     Family History  Problem Relation Age of Onset  . Cancer Mother     Past Surgical History:  Procedure Laterality Date  . BUNIONECTOMY    . CARPAL TUNNEL RELEASE Right   . FOOT FRACTURE SURGERY    . 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 Smoking status: Never Smoker  . Smokeless tobacco: Never Used  Substance and Sexual Activity  . Alcohol use: No    Alcohol/week: 0.0 standard drinks    Comment: quit 2004  . Drug use: No  . Sexual activity: Not on file

## 2020-09-04 ENCOUNTER — Emergency Department (HOSPITAL_BASED_OUTPATIENT_CLINIC_OR_DEPARTMENT_OTHER): Payer: Medicare HMO

## 2020-09-04 ENCOUNTER — Emergency Department (HOSPITAL_BASED_OUTPATIENT_CLINIC_OR_DEPARTMENT_OTHER)
Admission: EM | Admit: 2020-09-04 | Discharge: 2020-09-04 | Disposition: A | Discharge status: Home / Self Care | Payer: Medicare HMO | Attending: Emergency Medicine | Admitting: Emergency Medicine

## 2020-09-04 ENCOUNTER — Other Ambulatory Visit: Payer: Self-pay

## 2020-09-04 ENCOUNTER — Encounter (HOSPITAL_BASED_OUTPATIENT_CLINIC_OR_DEPARTMENT_OTHER): Payer: Self-pay

## 2020-09-04 DIAGNOSIS — F0281 Dementia in other diseases classified elsewhere with behavioral disturbance: Secondary | ICD-10-CM | POA: Insufficient documentation

## 2020-09-04 DIAGNOSIS — G4763 Sleep related bruxism: Secondary | ICD-10-CM | POA: Diagnosis not present

## 2020-09-04 DIAGNOSIS — Z79899 Other long term (current) drug therapy: Secondary | ICD-10-CM | POA: Diagnosis not present

## 2020-09-04 DIAGNOSIS — F458 Other somatoform disorders: Secondary | ICD-10-CM

## 2020-09-04 DIAGNOSIS — I10 Essential (primary) hypertension: Secondary | ICD-10-CM | POA: Diagnosis not present

## 2020-09-04 DIAGNOSIS — R4182 Altered mental status, unspecified: Secondary | ICD-10-CM | POA: Insufficient documentation

## 2020-09-04 DIAGNOSIS — G301 Alzheimer's disease with late onset: Secondary | ICD-10-CM | POA: Insufficient documentation

## 2020-09-04 DIAGNOSIS — E039 Hypothyroidism, unspecified: Secondary | ICD-10-CM | POA: Diagnosis not present

## 2020-09-04 DIAGNOSIS — Z853 Personal history of malignant neoplasm of breast: Secondary | ICD-10-CM | POA: Diagnosis not present

## 2020-09-04 DIAGNOSIS — R69 Illness, unspecified: Secondary | ICD-10-CM | POA: Diagnosis not present

## 2020-09-04 NOTE — ED Notes (Signed)
Patient verbalizes understanding of discharge instructions. Opportunity for questioning and answers were provided. Armband removed by staff, pt discharged from ED. Pt. ambulatory and discharged home.  

## 2020-09-04 NOTE — ED Triage Notes (Signed)
Patient here POV with Daughter from Home with Teeth Grinding. Grinding began earlier today.   Patient was referred by PCP. PCP was concerned for possible CVA.  Baseline Dementia. Ambulatory.

## 2020-09-04 NOTE — ED Provider Notes (Incomplete)
[facility] EMERGENCY DEPT Provider Note   CSN: [id number] Arrival date & time: 09/04/20  2002     History Chief Complaint  Patient presents with  . Teeth Grinding    Beverly Edwards is a 75 y.o. female.  The history is provided by the patient and medical records. No language interpreter was used.       Past Medical History:  Diagnosis Date  . Cancer ([place]) 10/1998   r breast  . Dementia ([place])   . Heart murmur   . History of positive PPD   . Hypertension   . Hypothyroid    s/p radioactive iodine  . Memory loss   . Osteopenia     Patient Active Problem List   Diagnosis Date Noted  . Hypothyroidism 02/09/2018  . Benign essential HTN 02/09/2018  . Foot injury, right, sequela 02/09/2018  . 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 Pain due to onychomycosis of nail 06/21/2016    Past Surgical History:  Procedure Laterality Date  . BUNIONECTOMY    . CARPAL TUNNEL RELEASE Right   . FOOT FRACTURE SURGERY    . MYOMECTOMY       OB History   No obstetric history on file.     Family History  Problem Relation Age of Onset  . Cancer Mother     Social History   Tobacco Use  . Smoking status: Never Smoker  . Smokeless tobacco: Never Used  Substance Use Topics  . Alcohol use: No    Alcohol/week: 0.0 standard drinks    Comment: No Longer since 2004  . Drug use: No    Home Medications Prior to Admission medications   Medication Sig Start Date End Date Taking? Authorizing Provider  donepezil (ARICEPT) 10 MG tablet Take 1 tablet (10 mg total) by mouth at bedtime. 05/03/16   Penumalli, Earlean Polka, MD  levothyroxine (SYNTHROID, LEVOTHROID) 75 MCG tablet Take 75 mcg by mouth daily. 11/20/17   [provider]  lisinopril (ZESTRIL) 5 MG tablet Take 5 mg by mouth daily. 07/31/19   [provider]    Allergies    Patient has no known allergies.  Review of Systems   Review of Systems  Physical  Exam Updated Vital Signs BP (!) 141/122 (BP Location: Right Arm)   Pulse 60   Temp 98.9 F (37.2 C) (Tympanic)   Resp 14   Ht 5\' 5"  (1.651 m)   Wt 40.8 kg   SpO2 100%   BMI 14.97 kg/m   Physical Exam  ED Results / Procedures / Treatments   Labs (all labs ordered are listed, but only abnormal results are displayed) Labs Reviewed - No data to display  EKG None  Radiology CT Head Wo Contrast  Result Date: 09/04/2020 CLINICAL DATA:  Mental status changes EXAM: CT HEAD WITHOUT CONTRAST TECHNIQUE: Contiguous axial images were obtained from the base of the skull through the vertex without intravenous contrast. COMPARISON:  10/08/2019 FINDINGS: Brain: Somewhat limited by patient motion artifact. No acute hemorrhage, acute infarction or space-occupying mass lesion is seen. Chronic atrophic and ischemic changes are again noted and stable. Vascular: No hyperdense vessel or unexpected calcification. Skull: Normal. Negative for fracture or focal lesion. Sinuses/Orbits: No acute finding. Other: None. IMPRESSION: Chronic atrophic and ischemic changes similar to that seen on the prior exam. No acute abnormality is noted. Electronically Signed   By: Inez Catalina M.D.   On: 09/04/2020 21:36    Procedures Procedures {Remember  to document critical care time when appropriate:1}  Medications Ordered in ED Medications - No data to display  ED Course  I have reviewed the triage vital signs and the nursing notes.  Pertinent labs & imaging results that were available during my care of the patient were reviewed by me and considered in my medical decision making (see chart for details).    MDM Rules/Calculators/A&P                          Beverly Edwards is a 75 y.o. female with a past medical history significant for hypertension, hypothyroidism, Alzheimer's, and previous breast cancer who presents with teeth grinding.  According to family, the patient has not had history of teeth grinding and today  was doing it.  When they called the PCP, PCP was concerned for possible stroke and recommended she come in for evaluation.  On my initial valuation, patient was not having any teeth grinding and was at her baseline.  Family reports mentally she is at her baseline with minimal verbal interaction but occasionally follows commands.  Family is saying she is not complaining of any headaches, does not seem to be any distress, is not having any cough congestion fevers, chills, nausea, vomiting, urinary or GI changes.  She does not have history of stroke or seizures to their knowledge.  On exam, there was no teeth grinding initially.  Patient moving all extremities                 Final Clinical Impression(s) / ED Diagnoses Final diagnoses:  None    Rx / DC Orders ED Discharge Orders    None

## 2020-09-04 NOTE — Discharge Instructions (Signed)
Your history and exam today are consistent with 1 day of teeth grinding.  We had a shared decision-making conversation about management and agreed to get a head CT given the concern for PCP for possible stroke.  There is not appear to be any acute abnormality and as we discussed, given her well appearance, we agreed to hold on significant lab testing or transfer for more advanced imaging such as MRI tonight.  Please have her follow-up with her PCP in the neck several days for reassessment as this may just be a temporary episode she is going through.  Please have her rest and make sure she is staying hydrated.  If any symptoms or changes acutely or she starts having new symptoms, please have her return to the nearest emergency department.

## 2020-09-04 NOTE — ED Provider Notes (Signed)
[facility] EMERGENC[facility] DEPT Provider Note   CSN: [id number] Arrival date & time: 09/04/20  2002     History Chief Complaint  Patient presents with  . Teeth Grinding    Beverly Edwards is a 75 y.o. female.  The history is provided by the patient and medical records. No language interpreter was used.  Altered Mental Status Presenting symptoms: behavior changes (teeth grinding)   Presenting symptoms: no partial responsiveness and no unresponsiveness   Severity:  Mild Most recent episode:  Today Episode history:  Multiple Timing:  Intermittent Progression:  Waxing and waning Chronicity:  New Context: dementia   Associated symptoms: no abdominal pain, no agitation, no difficulty breathing, no fever, no headaches, no nausea and no vomiting        Past Medical History:  Diagnosis Date  . Cancer ([place]) 10/1998   r breast  . Dementia ([place])   . Heart murmur   . History of positive PPD   . Hypertension   . Hypothyroid    s/p radioactive iodine  . Memory loss   . Osteopenia     Patient Active Problem List   Diagnosis Date Noted  . Hypothyroidism 02/09/2018  . Benign essential HTN 02/09/2018  . Foot injury, right, sequela 02/09/2018  . 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 Pain due to onychomycosis of nail 06/21/2016    Past Surgical History:  Procedure Laterality Date  . BUNIONECTOMY    . CARPAL TUNNEL RELEASE Right   . FOOT FRACTURE SURGERY    . MYOMECTOMY       OB History   No obstetric history on file.     Family History  Problem Relation Age of Onset  . Cancer Mother     Social History   Tobacco Use  . Smoking status: Never Smoker  . Smokeless tobacco: Never Used  Substance Use Topics  . Alcohol use: No    Alcohol/week: 0.0 standard drinks    Comment: No Longer since 2004  . Drug use: No    Home Medications Prior to Admission medications   Medication Sig Start Date End Date Taking?  Authorizing Provider  donepezil (ARICEPT) 10 MG tablet Take 1 tablet (10 mg total) by mouth at bedtime. 05/03/16   Penumalli, Earlean Polka, MD  levothyroxine (SYNTHROID, LEVOTHROID) 75 MCG tablet Take 75 mcg by mouth daily. 11/20/17   [provider]  lisinopril (ZESTRIL) 5 MG tablet Take 5 mg by mouth daily. 07/31/19   [provider]    Allergies    Patient has no known allergies.  Review of Systems   Review of Systems  Unable to perform ROS: Dementia  Constitutional: Negative for chills, fatigue and fever.  HENT: Negative for congestion.   Respiratory: Negative for cough and shortness of breath.   Cardiovascular: Negative for chest pain.  Gastrointestinal: Negative for abdominal pain, constipation, diarrhea, nausea and vomiting.  Musculoskeletal: Negative for back pain and neck pain.  Neurological: Negative for headaches.  Psychiatric/Behavioral: Negative for agitation.    Physical Exam Updated Vital Signs BP (!) 141/122 (BP Location: Right Arm)   Pulse 60   Temp 98.9 F (37.2 C) (Tympanic)   Resp 14   Ht 5\' 5"  (1.651 m)   Wt 40.8 kg   SpO2 100%   BMI 14.97 kg/m   Physical Exam Vitals and nursing note reviewed.  Constitutional:      General: She is not in acute distress.    Appearance: She is  well-developed. She is not ill-appearing, toxic-appearing or diaphoretic.  HENT:     Head: Normocephalic and atraumatic.     Nose: Nose normal. No congestion or rhinorrhea.     Mouth/Throat:     Mouth: Mucous membranes are moist.     Pharynx: No oropharyngeal exudate or posterior oropharyngeal erythema.  Eyes:     Extraocular Movements: Extraocular movements intact.     Conjunctiva/sclera: Conjunctivae normal.     Pupils: Pupils are equal, round, and reactive to light.  Cardiovascular:     Rate and Rhythm: Normal rate and regular rhythm.     Heart sounds: No murmur heard.   Pulmonary:     Effort: Pulmonary effort is normal. No respiratory distress.      Breath sounds: Normal breath sounds. No wheezing, rhonchi or rales.  Chest:     Chest wall: No tenderness.  Abdominal:     General: Abdomen is flat.     Palpations: Abdomen is soft.     Tenderness: There is no abdominal tenderness. There is no right CVA tenderness, left CVA tenderness, guarding or rebound.  Musculoskeletal:        General: No tenderness.     Cervical back: Neck supple. No tenderness.  Skin:    General: Skin is warm and dry.     Capillary Refill: Capillary refill takes less than 2 seconds.     Findings: No erythema.  Neurological:     Mental Status: She is alert. Mental status is at baseline.     Motor: No weakness.     ED Results / Procedures / Treatments   Labs (all labs ordered are listed, but only abnormal results are displayed) Labs Reviewed - No data to display  EKG None  Radiology CT Head Wo Contrast  Result Date: 09/04/2020 CLINICAL DATA:  Mental status changes EXAM: CT HEAD WITHOUT CONTRAST TECHNIQUE: Contiguous axial images were obtained from the base of the skull through the vertex without intravenous contrast. COMPARISON:  10/08/2019 FINDINGS: Brain: Somewhat limited by patient motion artifact. No acute hemorrhage, acute infarction or space-occupying mass lesion is seen. Chronic atrophic and ischemic changes are again noted and stable. Vascular: No hyperdense vessel or unexpected calcification. Skull: Normal. Negative for fracture or focal lesion. Sinuses/Orbits: No acute finding. Other: None. IMPRESSION: Chronic atrophic and ischemic changes similar to that seen on the prior exam. No acute abnormality is noted. Electronically Signed   By: Inez Catalina M.D.   On: 09/04/2020 21:36    Procedures Procedures   Medications Ordered in ED Medications - No data to display  ED Course  I have reviewed the triage vital signs and the nursing notes.  Pertinent labs & imaging results that were available during my care of the patient were reviewed by me and  considered in my medical decision making (see chart for details).    MDM Rules/Calculators/A&P                          Beverly Edwards is a 75 y.o. female with a past medical history significant for hypertension, hypothyroidism, Alzheimer's, and previous breast cancer who presents with teeth grinding.  According to family, the patient has not had history of teeth grinding and today was doing it.  When they called the PCP, PCP was concerned for possible stroke and recommended she come in for evaluation.  On my initial valuation, patient was not having any teeth grinding and was at her baseline.  Family  reports mentally she is at her baseline with minimal verbal interaction but occasionally follows commands.  Family is saying she is not complaining of any headaches, does not seem to be any distress, is not having any cough congestion fevers, chills, nausea, vomiting, urinary or GI changes.  She does not have history of stroke or seizures to their knowledge.  On exam, there was no teeth grinding initially.  Patient moving all extremities with purpose.  Appears to have sensation all extremities.  Pupils are symmetric and reactive with normal extraocular movements.  Symmetric smile.  Patient was not able answer questions but otherwise family reports is at mental status baseline.  Had a long shared decision-making conversation with patient family about management.  We discussed the possibility of stroke versus seizure causing this teeth grinding however as she appeared in no distress and otherwise appeared well, family agreed with doing a minimal work-up today and getting a head CT to screen to look for bleed or other large acute abnormality but if it is reassuring, patient we discharged home to follow-up with PCP.  We also agreed to hold on labs so the patient did not have to get stuck with an IV as she otherwise had no complaints.  CT scan showed no acute changes.  I went to reassess the patient and she was  having several episodes of teeth grinding.  She was still following some commands and interacting and moving all extremities during the episodes.  She was maintaining her airway and did not appear to be in distress.  Unclear etiology of the teeth grinding but I suspect it is more behavioral given her progressive dementia and Alzheimer's.  We discussed the possibility of it being a seizure but on witnessing it personally I do not suspect it is seizure-like.  There was no lipsmacking or other fasciculations or twitching.  After the discussion we agreed to let her go home to follow-up with PCP where they may end up referring her for outpatient MRI or further work-up but today we feel she is safe.  Family agrees and patient discharged in good condition with well appearance.             Final Clinical Impression(s) / ED Diagnoses Final diagnoses:  Teeth grinding    Rx / DC Orders ED Discharge Orders    None     Clinical Impression: 1. Teeth grinding     Disposition: Discharge  Condition: Good  I have discussed the results, Dx and Tx plan with the pt(& family if present). He/she/they expressed understanding and agree(s) with the plan. Discharge instructions discussed at great length. Strict return precautions discussed and pt &/or family have verbalized understanding of the instructions. No further questions at time of discharge.    Discharge Medication List as of 09/04/2020 11:17 PM      Follow Up: Harlan Stains, [place] [address] [facility] [place] [address] [phone]       Curlee Bogan, Gwenyth Allegra, MD 09/05/20 [phone]

## 2020-09-04 NOTE — ED Notes (Signed)
Will attempt vital signs again once in a patient room.

## 2020-09-04 NOTE — ED Notes (Signed)
MD Tegeler made aware of Patients Status. No further orders at this time.

## 2020-09-05 ENCOUNTER — Telehealth: Payer: Self-pay

## 2020-09-05 NOTE — Telephone Encounter (Signed)
Phone call placed to follow up from message received from patient's daughter re: grinding teeth. Spoke with patient's husband who stated that patient was checked at [facility] ED last night. Husband shared that he was advised that patient is ok, no acute findings. Inquired if patient is expressing any other changes in behaviors that may be indicative of pain. Discussed some other behaviors that may be done if patient is in pain. Husband stated that she appears comfortable. Visit with Palliative NP scheduled for follow up.

## 2020-09-10 ENCOUNTER — Other Ambulatory Visit: Payer: Self-pay

## 2020-09-10 ENCOUNTER — Other Ambulatory Visit: Payer: Medicare HMO | Admitting: Student

## 2020-09-10 DIAGNOSIS — G301 Alzheimer's disease with late onset: Secondary | ICD-10-CM

## 2020-09-10 DIAGNOSIS — Z515 Encounter for palliative care: Secondary | ICD-10-CM

## 2020-09-10 DIAGNOSIS — F458 Other somatoform disorders: Secondary | ICD-10-CM

## 2020-09-10 DIAGNOSIS — K5900 Constipation, unspecified: Secondary | ICD-10-CM

## 2020-09-10 DIAGNOSIS — F0281 Dementia in other diseases classified elsewhere with behavioral disturbance: Secondary | ICD-10-CM

## 2020-09-10 NOTE — Progress Notes (Signed)
[facility] Consult Note Telephone: [phone]  Fax: [phone]    Date of encounter: 09/10/20 PATIENT NAME: Beverly Edwards [address] [place] [address]   [phone] (home)  DOB: Oct 29, 1945 MRN: [id number] PRIMARY CARE PROVIDER:    Harlan Stains, MD,  [address] [place] [address] [phone]  REFERRING PROVIDER:   Harlan Stains, MD [address] [place],   [address] [phone]  RESPONSIBLE PARTY:    Contact Information    Name Relation Home Work Mobile   Vanderzanden,[place] Spouse [phone]     Purcell Nails Daughter   [phone]   Gita Kudo Granddaughter   Olyphant, [place] Granddaughter   [place], [place] Granddaughter   [phone]   Nickie Retort   [phone]       I met face to face with patient and family in the home. Palliative Care was asked to follow this patient by consultation request of  Harlan Stains, MD to address advance care planning and complex medical decision making. This is a follow up visit.                                   ASSESSMENT AND PLAN / RECOMMENDATIONS:   Advance Care Planning/Goals of Care: Goals include to maximize quality of life and symptom management. Our advance care planning conversation included a discussion about:     The value and importance of advance care planning   Experiences with loved ones who have been seriously ill or have died   Exploration of personal, cultural or spiritual beliefs that might influence medical decisions   Exploration of goals of care in the event of a sudden injury or illness   Identification and preparation of a healthcare agent   Review and updating or creation of an  advance directive document .  Decision not to resuscitate or to de-escalate disease focused treatments due to poor prognosis.  CODE STATUS: DNR  Symptom  Management/Plan:  Alzheimer's Dementia-FAST score 7A. Dependent for all adl's. Continue to assist with all adl's. Redirect/reorient as needed. Monitor for falls/safety. Continue donepezil 10m daily.   Teeth grinding-unclear etiology; seen in ED on 09/04/20. CT scan with no acute changes. Does not appear to be stroke or seizure related. Possibly related to her advanced dementia. Discussed mouth guard or cloth for patient to bite down on should behavior persist. Monitor for pain, anxiety.  Constipation-patient with irregular bowel movements. Recommend well-balanced diet with fiber, prune juice, adequate fluids. If no relief with prune juice, start Senna-S one tablet daily; will adjust dosage as needed.   Follow up Palliative Care Visit: Palliative care will continue to follow for complex medical decision making, advance care planning, and clarification of goals. Return in 6 weeks or prn.  I spent 40 minutes providing this consultation. More than 50% of the time in this consultation was spent in counseling and care coordination.   PPS: 50%  HOSPICE ELIGIBILITY/DIAGNOSIS: TBD  Chief Complaint: Palliative Medicine follow up visit; dementia.  HISTORY OF PRESENT ILLNESS:  Beverly NIXONis a 75y.o. year old female with advanced dementia, hypertension, hypothyroidism. Hx of left breast cancer.  Patient resides at home with husband. She is dependent for all adl's. She mumbles out but speech is mostly incoherent. She takes her medications without difficulty. No swallowing difficulty reported. Good appetite. Does pace/walk  about house; no agitation reported. Incontinent of bowel and bladder. Constipation reported. She was seen in ER on 09/04/20 due to grinding her teeth; no acute findings. Patient does not usually exhibit any signs of pain; husband states she will occasionally grimace. She usually sleeps well. No recent falls or injury.   History obtained from review of EMR, discussion with primary  team, and interview with family, facility staff/caregiver and/or Ms. Crist.  I reviewed available labs, medications, imaging, studies and related documents from the EMR.  Records reviewed and summarized above.   ROS  Patient unable to contribute due to advanced dementia  Physical Exam: Pulse 72, resp 16, b/p 110/62 Constitutional: NAD General: frail appearing, thin EYES: anicteric sclera, lids intact, no discharge  ENMT: no teeth grinding observed, oral mucous membranes moist CV: S1S2, RRR, no LE edema Pulmonary: LCTA, no increased work of breathing, no cough, room air Abdomen: normo-active BS + 4 quadrants, non tender GU: deferred MSK: moves all extremities, ambulatory Skin: cool and dry, no rashes or wounds on visible skin Neuro: generalized weakness, cognitive impairment Psych: non-anxious affect Hem/lymph/immuno: no widespread bruising   Thank you for the opportunity to participate in the care of Beverly Edwards.  The palliative care team will continue to follow. Please call our office at [phone] if we can be of additional assistance.    S , NP   COVID-19 PATIENT SCREENING TOOL Asked and negative response unless otherwise noted:   Have you had symptoms of covid, tested positive or been in contact with someone with symptoms/positive test in the past 5-10 days? No  

## 2020-10-17 ENCOUNTER — Other Ambulatory Visit: Payer: Medicare HMO | Admitting: Student

## 2020-10-17 ENCOUNTER — Other Ambulatory Visit: Payer: Self-pay

## 2020-10-17 DIAGNOSIS — Z515 Encounter for palliative care: Secondary | ICD-10-CM

## 2020-10-17 DIAGNOSIS — F02818 Dementia in other diseases classified elsewhere, unspecified severity, with other behavioral disturbance: Secondary | ICD-10-CM

## 2020-10-17 DIAGNOSIS — F458 Other somatoform disorders: Secondary | ICD-10-CM

## 2020-10-17 DIAGNOSIS — K5900 Constipation, unspecified: Secondary | ICD-10-CM

## 2020-10-17 DIAGNOSIS — F0281 Dementia in other diseases classified elsewhere with behavioral disturbance: Secondary | ICD-10-CM

## 2020-10-17 NOTE — Progress Notes (Signed)
[facility] Consult Note Telephone: [phone]  Fax: [phone]    Date of encounter: 10/17/20 PATIENT NAME: Beverly Edwards [address] [place] [address]   [phone] (home)  DOB: October 20, 1945 MRN: [id number] PRIMARY CARE PROVIDER:    Harlan Stains, MD,  [address] [place] [address] [phone]  REFERRING PROVIDER:   Harlan Stains, MD [address] [place],  [place] [address] [phone]  RESPONSIBLE PARTY:    Contact Information    Name Relation Home Work Mobile   Beverly Edwards,Beverly Edwards Spouse [phone]     Beverly Edwards Daughter   [phone]   Beverly Edwards Granddaughter   [place], [place] Granddaughter   [place], [place] Granddaughter   [phone]   Beverly Edwards   [phone]       I met face to face with patient and family in the home. Palliative Care was asked to follow this patient by consultation request of  Beverly Stains, MD to address advance care planning and complex medical decision making. This is a follow up visit.                                   ASSESSMENT AND PLAN / RECOMMENDATIONS:   Advance Care Planning/Goals of Care: Goals include to maximize quality of life and symptom management. Our advance care planning conversation included a discussion about:     The value and importance of advance care planning   Experiences with loved ones who have been seriously ill or have died   Exploration of personal, cultural or spiritual beliefs that might influence medical decisions   Exploration of goals of care in the event of a sudden injury or illness   CODE STATUS: DNR  Symptom Management/Plan:  Alzheimer's Dementia-FAST score 7B. Dependent for all adl's. Continue to assist with all adl's. Redirect/reorient as needed. Monitor for falls/safety. Continue donepezil 39m daily.   Teeth grinding-unclear etiology; seen in ED  on 09/04/20. CT scan with no acute changes. Does not appear to be stroke or seizure related. Appears to be related to her advanced dementia. Discussed mouth guard or cloth for patient to bite down on should behavior persist. Monitor for pain, anxiety.  Constipation-constipation improving, having more regular bowel movements. Continue well-balanced diet with fiber, prune juice, adequate fluids. Recommend Senna-S one tablet daily; will adjust dosage as needed   Follow up Palliative Care Visit: Palliative care will continue to follow for complex medical decision making, advance care planning, and clarification of goals. Return in 8 weeks or prn.  I spent 40 minutes providing this consultation. More than 50% of the time in this consultation was spent in counseling and care coordination.    PPS: 50%  HOSPICE ELIGIBILITY/DIAGNOSIS: TBD  Chief Complaint: Palliative Medicine follow up visit; dementia.  HISTORY OF PRESENT ILLNESS:  Beverly MCNICHOLASis a 75y.o. year old female  with advanced dementia, hypertension, hypothyroidism. Hx of left breast cancer.  Patient resides at home with husband; daughter JKennyth Loseinvolved and assist with care. She is dependent for all adl's. She mumbles out but speech is mostly incoherent. She takes her medications without difficulty. No swallowing difficulty reported. Good appetite. Continues to  pace/walk about house; no agitation reported. No recent falls or injury.  Incontinent of bowel and bladder. Constipation has improved with current regimen. She is sleeping well. No recent  ER visits or hospitalizations. Last ER visit on 09/04/20 due to grinding her teeth; no acute findings. Husband reports occasional teeth grinding, mostly noted at night.  Patient mumbles out during visit; speech mumbled incoherent. She does smile and left at times. Responds to her daughter, Beverly Edwards. Calm, no agitation observed during visit.   History obtained from review of EMR, discussion with  primary team, and interview with family, facility staff/caregiver and/or Beverly Edwards.  I reviewed available labs, medications, imaging, studies and related documents from the EMR.  Records reviewed and summarized above.   ROS  Unable to contribute due to advanced dementia.   Physical Exam: Pulse 64, resp 16, b/p 120/80 Constitutional: NAD, PAINAD-0 General: frail appearing, thin EYES: anicteric sclera, lids intact, no discharge  ENMT: intact hearing, oral mucous membranes moist CV: S1S2, RRR, no LE edema Pulmonary: LCTA, no increased work of breathing, no cough, room air Abdomen: normo-active BS + 4 quadrants, soft and non tender GU: deferred MSK: moves all extremities, ambulatory Skin: cool and dry, no rashes or wounds on visible skin Neuro:  no generalized weakness, cognitive impairment Psych: non-anxious affect Hem/lymph/immuno: no widespread bruising   Thank you for the opportunity to participate in the care of Beverly Edwards.  The palliative care team will continue to follow. Please call our office at [phone] if we can be of additional assistance.   Beverly Slocumb, NP   COVID-19 PATIENT SCREENING TOOL Asked and negative response unless otherwise noted:   Have you had symptoms of covid, tested positive or been in contact with someone with symptoms/positive test in the past 5-10 days? No

## 2020-11-04 ENCOUNTER — Ambulatory Visit: Payer: Medicare HMO | Admitting: Physician Assistant

## 2020-11-06 DIAGNOSIS — U071 COVID-19: Secondary | ICD-10-CM | POA: Diagnosis not present

## 2020-11-09 DIAGNOSIS — U071 COVID-19: Secondary | ICD-10-CM | POA: Diagnosis not present

## 2020-11-09 DIAGNOSIS — Z9189 Other specified personal risk factors, not elsewhere classified: Secondary | ICD-10-CM | POA: Diagnosis not present

## 2020-11-09 DIAGNOSIS — R059 Cough, unspecified: Secondary | ICD-10-CM | POA: Diagnosis not present

## 2020-11-16 ENCOUNTER — Emergency Department (HOSPITAL_BASED_OUTPATIENT_CLINIC_OR_DEPARTMENT_OTHER): Payer: Medicare HMO

## 2020-11-16 ENCOUNTER — Emergency Department (HOSPITAL_BASED_OUTPATIENT_CLINIC_OR_DEPARTMENT_OTHER)
Admission: EM | Admit: 2020-11-16 | Discharge: 2020-11-16 | Disposition: A | Discharge status: Home / Self Care | Payer: Medicare HMO | Attending: Emergency Medicine | Admitting: Emergency Medicine

## 2020-11-16 ENCOUNTER — Encounter (HOSPITAL_BASED_OUTPATIENT_CLINIC_OR_DEPARTMENT_OTHER): Payer: Self-pay | Admitting: Obstetrics and Gynecology

## 2020-11-16 ENCOUNTER — Other Ambulatory Visit: Payer: Self-pay

## 2020-11-16 DIAGNOSIS — I10 Essential (primary) hypertension: Secondary | ICD-10-CM | POA: Insufficient documentation

## 2020-11-16 DIAGNOSIS — F0390 Unspecified dementia without behavioral disturbance: Secondary | ICD-10-CM | POA: Insufficient documentation

## 2020-11-16 DIAGNOSIS — S0101XA Laceration without foreign body of scalp, initial encounter: Secondary | ICD-10-CM | POA: Diagnosis not present

## 2020-11-16 DIAGNOSIS — Z79899 Other long term (current) drug therapy: Secondary | ICD-10-CM | POA: Insufficient documentation

## 2020-11-16 DIAGNOSIS — Y92003 Bedroom of unspecified non-institutional (private) residence as the place of occurrence of the external cause: Secondary | ICD-10-CM | POA: Diagnosis not present

## 2020-11-16 DIAGNOSIS — E039 Hypothyroidism, unspecified: Secondary | ICD-10-CM | POA: Insufficient documentation

## 2020-11-16 DIAGNOSIS — M47812 Spondylosis without myelopathy or radiculopathy, cervical region: Secondary | ICD-10-CM | POA: Diagnosis not present

## 2020-11-16 DIAGNOSIS — Z043 Encounter for examination and observation following other accident: Secondary | ICD-10-CM | POA: Diagnosis not present

## 2020-11-16 DIAGNOSIS — W06XXXA Fall from bed, initial encounter: Secondary | ICD-10-CM | POA: Insufficient documentation

## 2020-11-16 DIAGNOSIS — Y9389 Activity, other specified: Secondary | ICD-10-CM | POA: Diagnosis not present

## 2020-11-16 DIAGNOSIS — Z853 Personal history of malignant neoplasm of breast: Secondary | ICD-10-CM | POA: Diagnosis not present

## 2020-11-16 DIAGNOSIS — R69 Illness, unspecified: Secondary | ICD-10-CM | POA: Diagnosis not present

## 2020-11-16 MED ORDER — LIDOCAINE-EPINEPHRINE-TETRACAINE (LET) TOPICAL GEL
3.0000 mL | Freq: Once | TOPICAL | Status: AC
  Administered 2020-11-16: 3 mL via TOPICAL
  Filled 2020-11-16: qty 3

## 2020-11-16 MED ORDER — LIDOCAINE-EPINEPHRINE 2 %-1:100000 IJ SOLN: 20.0000 mL | Freq: Once | INTRAMUSCULAR | Status: DC

## 2020-11-16 NOTE — Discharge Instructions (Addendum)
Please follow-up with primary care doctor for recheck in 5 to 7 days for staple removal and wound reassessment.  Keep wound clean and dry.  Avoid vigorous scrubbing.  Can shower.  Come back to ER if she develops worsening bleeding, and any additional falls, redness, pus drainage from wound or other new concerns.

## 2020-11-16 NOTE — ED Triage Notes (Signed)
Patient reports to the ER for a fall. Patient is not on any blood thinners and reportedly hit her head on the nightstand. Patient is at baseline per family member

## 2020-11-16 NOTE — ED Provider Notes (Signed)
[facility] Provider Note   CSN: [id number] Arrival date & time: 11/16/20  1547     History Chief Complaint  Patient presents with   Beverly Edwards is a 75 y.o. female.  Presents to ER with concern for fall.  Patient has advanced dementia.  Lives at home with father and daughter.  History provided by daughter.  Daughter reports that patient had witnessed fall while getting out of bed, hit head on nightstand.  Laceration noted to top of head.  No loss of consciousness.  Patient was acting at baseline prior to fall and after fall.  Has been eating and drinking per usual.  No fevers, chills.  No other recent falls.  Completed chart review, history of advanced dementia, followed by palliative care.  HPI     Past Medical History:  Diagnosis Date   Cancer ([facility]) 10/1998   r breast   Dementia ([facility])    Heart murmur    History of positive PPD    Hypertension    Hypothyroid    s/p radioactive iodine   Memory loss    Osteopenia     Patient Active Problem List   Diagnosis Date Noted   Hypothyroidism 02/09/2018   Benign essential HTN 02/09/2018   Foot injury, right, sequela 02/09/2018   Late onset Alzheimer's disease with behavioral disturbance ([facility]) 09/20/2016   Pain due to onychomycosis of nail 06/21/2016    Past Surgical History:  Procedure Laterality Date   BUNIONECTOMY     CARPAL TUNNEL RELEASE Right    FOOT FRACTURE SURGERY     MYOMECTOMY       OB History     Gravida      Para      Term      Preterm      AB      Living  1      SAB      IAB      Ectopic      Multiple      Live Births              Family History  Problem Relation Age of Onset   Cancer Mother     Social History   Tobacco Use   Smoking status: Never   Smokeless tobacco: Never  Vaping Use   Vaping Use: Never used  Substance Use Topics   Alcohol use: No    Alcohol/week: 0.0 standard drinks    Comment: No Longer since 2004   Drug  use: No    Home Medications Prior to Admission medications   Medication Sig Start Date End Date Taking? Authorizing Provider  levothyroxine (SYNTHROID, LEVOTHROID) 75 MCG tablet Take 75 mcg by mouth daily. 11/20/17  Yes [provider]  lisinopril (ZESTRIL) 5 MG tablet Take 5 mg by mouth daily. 07/31/19  Yes [provider]  donepezil (ARICEPT) 10 MG tablet Take 1 tablet (10 mg total) by mouth at bedtime. 05/03/16   Penumalli, Earlean Polka, MD    Allergies    Patient has no known allergies.  Review of Systems   Review of Systems  Unable to perform ROS: Dementia   Physical Exam Updated Vital Signs BP 106/67   Pulse (!) 51   Temp 97.7 F (36.5 C) (Tympanic)   Resp 16   Ht 5\' 5"  (1.651 m)   Wt 31.8 kg   SpO2 100%   BMI 11.65 kg/m   Physical Exam Vitals and nursing  note reviewed.  Constitutional:      General: She is not in acute distress.    Appearance: She is well-developed.  HENT:     Head: Normocephalic.     Comments: Of 3 cm scalp laceration, no active bleeding noted Eyes:     Conjunctiva/sclera: Conjunctivae normal.  Cardiovascular:     Rate and Rhythm: Normal rate and regular rhythm.     Heart sounds: No murmur heard. Pulmonary:     Effort: Pulmonary effort is normal. No respiratory distress.     Breath sounds: Normal breath sounds.  Abdominal:     Palpations: Abdomen is soft.     Tenderness: There is no abdominal tenderness.  Musculoskeletal:     Cervical back: Neck supple.  Skin:    General: Skin is warm and dry.  Neurological:     General: No focal deficit present.     Mental Status: She is alert.    ED Results / Procedures / Treatments   Labs (all labs ordered are listed, but only abnormal results are displayed) Labs Reviewed - No data to display  EKG None  Radiology CT Head Wo Contrast  Result Date: 11/16/2020 CLINICAL DATA:  Fall. EXAM: CT HEAD WITHOUT CONTRAST CT CERVICAL SPINE WITHOUT CONTRAST TECHNIQUE: Multidetector CT  imaging of the head and cervical spine was performed following the standard protocol without intravenous contrast. Multiplanar CT image reconstructions of the cervical spine were also generated. COMPARISON:  September 04, 2020.  Oct 08, 2019. FINDINGS: CT HEAD FINDINGS Brain: Mild diffuse cortical atrophy is noted. Mild chronic ischemic white matter disease is noted. No mass effect or midline shift is noted. Ventricular size is within normal limits. There is no evidence of mass lesion, hemorrhage or acute infarction. Vascular: No hyperdense vessel or unexpected calcification. Skull: Normal. Negative for fracture or focal lesion. Sinuses/Orbits: No acute finding. Other: None. CT CERVICAL SPINE FINDINGS Alignment: Reversal of normal lordosis is noted which most likely is positional in origin. Skull base and vertebrae: No acute fracture. No primary bone lesion or focal pathologic process. Soft tissues and spinal canal: No prevertebral fluid or swelling. No visible canal hematoma. Disc levels: Severe degenerative disc disease is noted at C4-5, C5-6 and C6-7. Upper chest: Negative. Other: None. IMPRESSION: No acute intracranial abnormality seen. Severe multilevel degenerative disc disease. No acute abnormality seen in the cervical spine. Electronically Signed   By: Marijo Conception M.D.   On: 11/16/2020 16:48   CT Cervical Spine Wo Contrast  Result Date: 11/16/2020 CLINICAL DATA:  Fall. EXAM: CT HEAD WITHOUT CONTRAST CT CERVICAL SPINE WITHOUT CONTRAST TECHNIQUE: Multidetector CT imaging of the head and cervical spine was performed following the standard protocol without intravenous contrast. Multiplanar CT image reconstructions of the cervical spine were also generated. COMPARISON:  September 04, 2020.  Oct 08, 2019. FINDINGS: CT HEAD FINDINGS Brain: Mild diffuse cortical atrophy is noted. Mild chronic ischemic white matter disease is noted. No mass effect or midline shift is noted. Ventricular size is within normal limits.  There is no evidence of mass lesion, hemorrhage or acute infarction. Vascular: No hyperdense vessel or unexpected calcification. Skull: Normal. Negative for fracture or focal lesion. Sinuses/Orbits: No acute finding. Other: None. CT CERVICAL SPINE FINDINGS Alignment: Reversal of normal lordosis is noted which most likely is positional in origin. Skull base and vertebrae: No acute fracture. No primary bone lesion or focal pathologic process. Soft tissues and spinal canal: No prevertebral fluid or swelling. No visible canal hematoma. Disc levels: Severe  degenerative disc disease is noted at C4-5, C5-6 and C6-7. Upper chest: Negative. Other: None. IMPRESSION: No acute intracranial abnormality seen. Severe multilevel degenerative disc disease. No acute abnormality seen in the cervical spine. Electronically Signed   By: Marijo Conception M.D.   On: 11/16/2020 16:48    Procedures .Marland KitchenLaceration Repair  Date/Time: 11/16/2020 11:18 PM Performed by: Lucrezia Starch, MD Authorized by: Lucrezia Starch, MD   Consent:    Consent obtained:  Verbal   Consent given by:  Guardian   Alternatives discussed:  No treatment, delayed treatment, observation and referral Universal protocol:    Immediately prior to procedure, a time out was called: yes     Patient identity confirmed:  Verbally with patient Anesthesia:    Anesthesia method:  Topical application   Topical anesthetic:  LET Laceration details:    Length (cm):  3 Treatment:    Area cleansed with:  Saline   Amount of cleaning:  Extensive   Irrigation method:  Syringe Skin repair:    Repair method:  Staples   Number of staples:  3 Approximation:    Approximation:  Close Repair type:    Repair type:  Simple Post-procedure details:    Procedure completion:  Tolerated well, no immediate complications   Medications Ordered in ED Medications  lidocaine-EPINEPHrine-tetracaine (LET) topical gel (3 mLs Topical Given 11/16/20 1640)    ED Course  I have  reviewed the triage vital signs and the nursing notes.  Pertinent labs & imaging results that were available during my care of the patient were reviewed by me and considered in my medical decision making (see chart for details).    MDM Rules/Calculators/A&P                          75 year old lady with advanced dementia presents to ER with daughter.  Found to have 3 cm scalp laceration.  Daughter states this was witnessed fall, no LOC, at baseline, patient acting appropriately.  CT negative for any acute traumatic pathology.  Performed staple repair.  Discharge. Final Clinical Impression(s) / ED Diagnoses Final diagnoses:  Laceration of scalp, initial encounter    Rx / DC Orders ED Discharge Orders     None        Lucrezia Starch, MD 11/16/20 2318

## 2020-11-21 DIAGNOSIS — S0101XA Laceration without foreign body of scalp, initial encounter: Secondary | ICD-10-CM | POA: Diagnosis not present

## 2020-12-24 DIAGNOSIS — G301 Alzheimer's disease with late onset: Secondary | ICD-10-CM | POA: Diagnosis not present

## 2020-12-24 DIAGNOSIS — Z515 Encounter for palliative care: Secondary | ICD-10-CM | POA: Diagnosis not present

## 2020-12-24 DIAGNOSIS — R2681 Unsteadiness on feet: Secondary | ICD-10-CM | POA: Diagnosis not present

## 2020-12-24 DIAGNOSIS — R69 Illness, unspecified: Secondary | ICD-10-CM | POA: Diagnosis not present

## 2020-12-26 ENCOUNTER — Ambulatory Visit (INDEPENDENT_AMBULATORY_CARE_PROVIDER_SITE_OTHER): Payer: Medicare HMO | Admitting: Physician Assistant

## 2020-12-26 ENCOUNTER — Encounter: Payer: Self-pay | Admitting: Physician Assistant

## 2020-12-26 DIAGNOSIS — B351 Tinea unguium: Secondary | ICD-10-CM

## 2020-12-26 NOTE — Progress Notes (Signed)
Office Visit Note   Patient: Beverly [facility]           Date of Birth: [birth year]           MRN: [id number] Visit Date: 12/26/2020              Requested by: Harlan Stains, MD [address] [place],  [place] [address] PCP: Harlan Stains, MD  Chief Complaint  Patient presents with   Right Foot - Follow-up   Left Foot - Follow-up      HPI: Patient is a pleasant 75 year old woman who is accompanied by her husband.  She has a history of Alzheimer's disease and comes in for periodic nail trim.  Her husband does not report any other issues  Assessment & Plan: Visit Diagnoses: No diagnosis found.  Plan: Onychomycotic nails trimmed x10 without difficulty  Follow-Up Instructions: No follow-ups on file.   Ortho Exam  Patient is alert, oriented, no adenopathy, well-dressed, normal affect, normal respiratory effort. Examination of her feet no ulcers no swelling no cellulitis on onychomycotic nails x10 toes are warm  Imaging: No results found. No images are attached to the encounter.  Labs: No results found for: HGBA1C, ESRSEDRATE, CRP, LABURIC, REPTSTATUS, GRAMSTAIN, CULT, LABORGA   No results found for: ALBUMIN, PREALBUMIN, CBC  No results found for: MG No results found for: VD25OH  No results found for: PREALBUMIN CBC EXTENDED Latest Ref Rng & Units 10/08/2019  WBC 4.0 - 10.5 K/uL 10.2  RBC 3.87 - 5.11 MIL/uL 4.71  HGB 12.0 - 15.0 g/dL 14.0  HCT 36.0 - 46.0 % 43.7  PLT 150 - 400 K/uL 212  NEUTROABS 1.7 - 7.7 K/uL 8.3(H)  LYMPHSABS 0.7 - 4.0 K/uL 1.1     There is no height or weight on file to calculate BMI.  Orders:  No orders of the defined types were placed in this encounter.  No orders of the defined types were placed in this encounter.    Procedures: No procedures performed  Clinical Data: No additional findings.  ROS:  All other systems negative, except as noted in the HPI. Review of Systems  Objective: Vital Signs: There were no  vitals taken for this visit.  Specialty Comments:  No specialty comments available.  PMFS History: Patient Active Problem List   Diagnosis Date Noted   Hypothyroidism 02/09/2018   Benign essential HTN 02/09/2018   Foot injury, right, sequela 02/09/2018   Late onset Alzheimer's disease with behavioral disturbance ([facility]) 09/20/2016   Pain due to onychomycosis of nail 06/21/2016   Past Medical History:  Diagnosis Date   Cancer ([facility]) 10/1998   r breast   Dementia ([facility])    Heart murmur    History of positive PPD    Hypertension    Hypothyroid    s/p radioactive iodine   Memory loss    Osteopenia     Family History  Problem Relation Age of Onset   Cancer Mother     Past Surgical History:  Procedure Laterality Date   BUNIONECTOMY     CARPAL TUNNEL RELEASE Right    FOOT FRACTURE SURGERY     MYOMECTOMY     Social History   Occupational History    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

## 2020-12-28 ENCOUNTER — Other Ambulatory Visit: Payer: Self-pay

## 2020-12-28 ENCOUNTER — Emergency Department (HOSPITAL_COMMUNITY): Payer: Medicare HMO

## 2020-12-28 ENCOUNTER — Inpatient Hospital Stay (HOSPITAL_COMMUNITY)
Admission: EM | Admit: 2020-12-28 | Discharge: 2020-12-31 | DRG: 100 | Disposition: A | Discharge status: Hospice | Payer: Medicare HMO | Attending: Internal Medicine | Admitting: Internal Medicine

## 2020-12-28 ENCOUNTER — Encounter (HOSPITAL_COMMUNITY): Payer: Self-pay | Admitting: Emergency Medicine

## 2020-12-28 DIAGNOSIS — R680 Hypothermia, not associated with low environmental temperature: Secondary | ICD-10-CM | POA: Diagnosis present

## 2020-12-28 DIAGNOSIS — E162 Hypoglycemia, unspecified: Secondary | ICD-10-CM

## 2020-12-28 DIAGNOSIS — R001 Bradycardia, unspecified: Secondary | ICD-10-CM

## 2020-12-28 DIAGNOSIS — R011 Cardiac murmur, unspecified: Secondary | ICD-10-CM | POA: Diagnosis not present

## 2020-12-28 DIAGNOSIS — Z66 Do not resuscitate: Secondary | ICD-10-CM | POA: Diagnosis present

## 2020-12-28 DIAGNOSIS — E039 Hypothyroidism, unspecified: Secondary | ICD-10-CM

## 2020-12-28 DIAGNOSIS — U071 COVID-19: Secondary | ICD-10-CM | POA: Diagnosis not present

## 2020-12-28 DIAGNOSIS — G40409 Other generalized epilepsy and epileptic syndromes, not intractable, without status epilepticus: Secondary | ICD-10-CM | POA: Diagnosis not present

## 2020-12-28 DIAGNOSIS — Z7189 Other specified counseling: Secondary | ICD-10-CM | POA: Diagnosis not present

## 2020-12-28 DIAGNOSIS — F0280 Dementia in other diseases classified elsewhere without behavioral disturbance: Secondary | ICD-10-CM

## 2020-12-28 DIAGNOSIS — R633 Feeding difficulties, unspecified: Secondary | ICD-10-CM | POA: Diagnosis present

## 2020-12-28 DIAGNOSIS — E890 Postprocedural hypothyroidism: Secondary | ICD-10-CM | POA: Diagnosis not present

## 2020-12-28 DIAGNOSIS — G301 Alzheimer's disease with late onset: Secondary | ICD-10-CM

## 2020-12-28 DIAGNOSIS — E43 Unspecified severe protein-calorie malnutrition: Secondary | ICD-10-CM | POA: Diagnosis not present

## 2020-12-28 DIAGNOSIS — I739 Peripheral vascular disease, unspecified: Secondary | ICD-10-CM | POA: Diagnosis not present

## 2020-12-28 DIAGNOSIS — R0689 Other abnormalities of breathing: Secondary | ICD-10-CM | POA: Diagnosis not present

## 2020-12-28 DIAGNOSIS — Z79899 Other long term (current) drug therapy: Secondary | ICD-10-CM

## 2020-12-28 DIAGNOSIS — R451 Restlessness and agitation: Secondary | ICD-10-CM | POA: Diagnosis present

## 2020-12-28 DIAGNOSIS — Z79890 Hormone replacement therapy (postmenopausal): Secondary | ICD-10-CM

## 2020-12-28 DIAGNOSIS — Z8616 Personal history of COVID-19: Secondary | ICD-10-CM

## 2020-12-28 DIAGNOSIS — I10 Essential (primary) hypertension: Secondary | ICD-10-CM | POA: Diagnosis not present

## 2020-12-28 DIAGNOSIS — R627 Adult failure to thrive: Secondary | ICD-10-CM | POA: Diagnosis not present

## 2020-12-28 DIAGNOSIS — Z515 Encounter for palliative care: Secondary | ICD-10-CM

## 2020-12-28 DIAGNOSIS — W19XXXA Unspecified fall, initial encounter: Secondary | ICD-10-CM | POA: Diagnosis not present

## 2020-12-28 DIAGNOSIS — Y929 Unspecified place or not applicable: Secondary | ICD-10-CM

## 2020-12-28 DIAGNOSIS — R69 Illness, unspecified: Secondary | ICD-10-CM | POA: Diagnosis not present

## 2020-12-28 DIAGNOSIS — R569 Unspecified convulsions: Secondary | ICD-10-CM | POA: Diagnosis not present

## 2020-12-28 DIAGNOSIS — F0391 Unspecified dementia with behavioral disturbance: Secondary | ICD-10-CM

## 2020-12-28 DIAGNOSIS — F0281 Dementia in other diseases classified elsewhere with behavioral disturbance: Secondary | ICD-10-CM | POA: Diagnosis present

## 2020-12-28 DIAGNOSIS — Z743 Need for continuous supervision: Secondary | ICD-10-CM | POA: Diagnosis not present

## 2020-12-28 DIAGNOSIS — F03918 Unspecified dementia, unspecified severity, with other behavioral disturbance: Secondary | ICD-10-CM

## 2020-12-28 DIAGNOSIS — R4020 Unspecified coma: Secondary | ICD-10-CM | POA: Diagnosis not present

## 2020-12-28 DIAGNOSIS — T68XXXA Hypothermia, initial encounter: Secondary | ICD-10-CM | POA: Diagnosis not present

## 2020-12-28 DIAGNOSIS — R55 Syncope and collapse: Secondary | ICD-10-CM | POA: Diagnosis not present

## 2020-12-28 DIAGNOSIS — R0902 Hypoxemia: Secondary | ICD-10-CM | POA: Diagnosis not present

## 2020-12-28 LAB — COMPREHENSIVE METABOLIC PANEL
ALT: 16 U/L (ref 0–44)
AST: 27 U/L (ref 15–41)
Albumin: 3.3 g/dL — ABNORMAL LOW (ref 3.5–5.0)
Alkaline Phosphatase: 64 U/L (ref 38–126)
Anion gap: 8 (ref 5–15)
BUN: 9 mg/dL (ref 8–23)
CO2: 25 mmol/L (ref 22–32)
Calcium: 9.5 mg/dL (ref 8.9–10.3)
Chloride: 105 mmol/L (ref 98–111)
Creatinine, Ser: 0.89 mg/dL (ref 0.44–1.00)
GFR, Estimated: >60 mL/min (ref >60)
Glucose, Bld: 87 mg/dL (ref 70–99)
Potassium: 4.1 mmol/L (ref 3.5–5.1)
Sodium: 138 mmol/L (ref 135–145)
Total Bilirubin: 0.6 mg/dL (ref 0.3–1.2)
Total Protein: 6.7 g/dL (ref 6.5–8.1)

## 2020-12-28 LAB — RESP PANEL BY RT-PCR (FLU A&B, COVID) ARPGX2
Influenza A by PCR: NEGATIVE
Influenza B by PCR: NEGATIVE
SARS Coronavirus 2 by RT PCR: POSITIVE — AB

## 2020-12-28 LAB — CBC WITH DIFFERENTIAL/PLATELET
Abs Immature Granulocytes: 0.03 10*3/uL (ref 0.00–0.07)
Basophils Absolute: 0 10*3/uL (ref 0.0–0.1)
Basophils Relative: 0 %
Eosinophils Absolute: 0 10*3/uL (ref 0.0–0.5)
Eosinophils Relative: 0 %
HCT: 47.1 % — ABNORMAL HIGH (ref 36.0–46.0)
Hemoglobin: 15.1 g/dL — ABNORMAL HIGH (ref 12.0–15.0)
Immature Granulocytes: 1 %
Lymphocytes Relative: 21 %
Lymphs Abs: 1.2 10*3/uL (ref 0.7–4.0)
MCH: 30 pg (ref 26.0–34.0)
MCHC: 32.1 g/dL (ref 30.0–36.0)
MCV: 93.5 fL (ref 80.0–100.0)
Monocytes Absolute: 0.2 10*3/uL (ref 0.1–1.0)
Monocytes Relative: 3 %
Neutro Abs: 4.2 10*3/uL (ref 1.7–7.7)
Neutrophils Relative %: 75 %
Platelets: 279 10*3/uL (ref 150–400)
RBC: 5.04 MIL/uL (ref 3.87–5.11)
RDW: 14.3 % (ref 11.5–15.5)
WBC: 5.6 10*3/uL (ref 4.0–10.5)
nRBC: 0 % (ref 0.0–0.2)

## 2020-12-28 LAB — T4, FREE: Free T4: 0.74 ng/dL (ref 0.61–1.12)

## 2020-12-28 LAB — URINALYSIS, ROUTINE W REFLEX MICROSCOPIC
Bilirubin Urine: NEGATIVE
Glucose, UA: NEGATIVE mg/dL
Hgb urine dipstick: NEGATIVE
Ketones, ur: NEGATIVE mg/dL
Leukocytes,Ua: NEGATIVE
Nitrite: NEGATIVE
Protein, ur: NEGATIVE mg/dL
Specific Gravity, Urine: 1.006 (ref 1.005–1.030)
pH: 7 (ref 5.0–8.0)

## 2020-12-28 LAB — CBG MONITORING, ED
Glucose-Capillary: 55 mg/dL — ABNORMAL LOW (ref 70–99)
Glucose-Capillary: 145 mg/dL — ABNORMAL HIGH (ref 70–99)
Glucose-Capillary: 78 mg/dL (ref 70–99)

## 2020-12-28 LAB — TSH: TSH: 45.405 u[IU]/mL — ABNORMAL HIGH (ref 0.350–4.500)

## 2020-12-28 LAB — TROPONIN I (HIGH SENSITIVITY)
Troponin I (High Sensitivity): 6 ng/L (ref <18)
Troponin I (High Sensitivity): 10 ng/L (ref <18)

## 2020-12-28 LAB — CK: Total CK: 120 U/L (ref 38–234)

## 2020-12-28 MED ORDER — ACETAMINOPHEN 650 MG RE SUPP: 650.0000 mg | Freq: Four times a day (QID) | RECTAL | Status: DC | PRN

## 2020-12-28 MED ORDER — DEXTROSE-NACL 5-0.9 % IV SOLN: INTRAVENOUS | Status: DC

## 2020-12-28 MED ORDER — LEVETIRACETAM IN NACL 500 MG/100ML IV SOLN
500.0000 mg | Freq: Two times a day (BID) | INTRAVENOUS | Status: DC
  Filled 2020-12-28: qty 100

## 2020-12-28 MED ORDER — DEXTROSE 50 % IV SOLN
1.0000 | Freq: Once | INTRAVENOUS | Status: AC
  Administered 2020-12-28: 50 mL via INTRAVENOUS
  Filled 2020-12-28: qty 50

## 2020-12-28 MED ORDER — ACETAMINOPHEN 325 MG PO TABS: 650.0000 mg | ORAL_TABLET | Freq: Four times a day (QID) | ORAL | Status: DC | PRN

## 2020-12-28 MED ORDER — LEVETIRACETAM IN NACL 1000 MG/100ML IV SOLN
1000.0000 mg | Freq: Once | INTRAVENOUS | Status: AC
  Administered 2020-12-28: 1000 mg via INTRAVENOUS
  Filled 2020-12-28: qty 100

## 2020-12-28 MED ORDER — HYDRALAZINE HCL 20 MG/ML IJ SOLN: 10.0000 mg | Freq: Four times a day (QID) | INTRAMUSCULAR | Status: DC | PRN

## 2020-12-28 MED ORDER — SODIUM CHLORIDE 0.9 % IV SOLN: INTRAVENOUS | Status: DC

## 2020-12-28 MED ORDER — NALOXONE HCL 4 MG/0.1ML NA LIQD
1.0000 | Freq: Once | NASAL | Status: AC
  Administered 2020-12-28: 1 via NASAL
  Filled 2020-12-28: qty 4

## 2020-12-28 MED ORDER — ATROPINE SULFATE 1 MG/ML IJ SOLN: 0.4000 mg | Freq: Once | INTRAMUSCULAR | Status: DC

## 2020-12-28 MED ORDER — POLYETHYLENE GLYCOL 3350 17 G PO PACK: 17.0000 g | PACK | Freq: Every day | ORAL | Status: DC | PRN

## 2020-12-28 MED ORDER — ONDANSETRON HCL 4 MG PO TABS: 4.0000 mg | ORAL_TABLET | Freq: Four times a day (QID) | ORAL | Status: DC | PRN

## 2020-12-28 MED ORDER — LEVOTHYROXINE SODIUM 100 MCG/5ML IV SOLN
50.0000 ug | Freq: Every day | INTRAVENOUS | Status: DC
Start: 2020-12-29 — End: 2021-01-01
  Administered 2020-12-29 – 2020-12-31 (×3): 50 ug via INTRAVENOUS
  Filled 2020-12-28 (×5): qty 5

## 2020-12-28 MED ORDER — ONDANSETRON HCL 4 MG/2ML IJ SOLN: 4.0000 mg | Freq: Four times a day (QID) | INTRAMUSCULAR | Status: DC | PRN

## 2020-12-28 MED ORDER — ENOXAPARIN SODIUM 40 MG/0.4ML IJ SOSY
40.0000 mg | PREFILLED_SYRINGE | Freq: Every day | INTRAMUSCULAR | Status: DC
  Administered 2020-12-29 – 2020-12-31 (×3): 40 mg via SUBCUTANEOUS
  Filled 2020-12-28 (×3): qty 0.4

## 2020-12-28 MED ORDER — LACTATED RINGERS IV BOLUS
1000.0000 mL | Freq: Once | INTRAVENOUS | Status: AC
  Administered 2020-12-28: 1000 mL via INTRAVENOUS

## 2020-12-28 NOTE — ED Notes (Signed)
Unsuccess in and out cath. Pt had wet diaper. Dr. Pearline Cables made aware. Pt cleaned and new diaper applied.

## 2020-12-28 NOTE — ED Notes (Signed)
Pt with abnormal activity. Pt begin to shake and pt eyed rolled. Dr. Pearline Cables made aware and at bedside to see patient. Per MD pt having a seizure. Activity lasted about 1 minute. Pt suctioned afterward. Pacing pads placed on  Patient. MD made aware 12 lead not able to be fully applied to to patient small size and the pacing pads. Pt BG checked. Per MD hold atropine at this time. Orders to follow.
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
## 2020-12-28 NOTE — H&P (Signed)
History and Physical    Beverly Edwards [id number] DOB: [birth year] DOA: 12/28/2020  PCP: Harlan Stains, MD  Patient coming from: Home via EMS   Chief Complaint: Altered Mental Status    HPI:    75 year old female with past medical history of Alzheimer's dementia (dx approx 2010), peripheral vascular disease, hypertension, hypothyroidism who presents to [facility] emergency department via EMS after experiencing witnessed loss of consciousness in the home.  Patient is nonverbal and is unable to provide history and therefore the majority the history is been obtained from the daughter via phone conversation.  Daughter explains that patient has a longstanding history of Alzheimer's dementia, diagnosed around 2010.  Patient's dementia has gradually progressed.  She is minimally verbal, requires significant assistance with oral intake and activities of daily living.  Daughter explains that particularly in the past several months patient has now required assistance with ambulation as she has significant difficulty with balance.  Daughter explains that earlier in the day on 8/14 she was bathing her mother when her mother's "feet came out from under her" and patient was assisted to the ground by the daughter.  Daughter reports that the patient began foaming at the mouth.  Shortly thereafter, daughter observed that the patient was barely breathing from what sounds to be agonal respirations.  The daughter called 30 and was coached to initiate CPR due to concerns that the patient may be suffering from cardiac arrest.  Upon EMS arrival patient was found to have a pulse however temporarily required bag-valve-mask respirations.  Her respirations somewhat improved.  A dose of Narcan was administered and the patient was brought into [facility] emergency department for further evaluation.    Upon evaluation in the emergency department, CT imaging of the brain revealed substantial atrophy.   During patient's work-up patient began to exhibit seizure-like activity witnessed by the emergency room provider.  Seizure activity, which the provider describes as tonic-clonic seemed to spontaneously abort without intervention.  ER provider feels that this is probably what happened in the home prior to being brought here.  Patient was loaded with intravenous Keppra.  Case was discussed briefly with Dr. Curly Shores with neurology who stated that they will come see the patient in consultation.  Patient was additionally found to be quite bradycardic with initial question of developing complete heart block.  ER provider then discussed case with on-call cardiology fellow who came to evaluate the patient's EKG and did not believe that the patient was in heart block.  Patient was also found to be hypoglycemic and therefore was placed on D5 normal saline infusion.  Hospitalist group was then called to assess patient for admission to the hospital.  Review of Systems:   Review of Systems  Unable to perform ROS: Patient nonverbal   Past Medical History:  Diagnosis Date   Cancer ([place]) 10/1998   r breast   Dementia ([place])    Heart murmur    History of positive PPD    Hypertension    Hypothyroid    s/p radioactive iodine   Memory loss    Osteopenia     Past Surgical History:  Procedure Laterality Date   BUNIONECTOMY     CARPAL TUNNEL RELEASE Right    FOOT FRACTURE SURGERY     MYOMECTOMY       reports that she has never smoked. She has never used smokeless tobacco. She reports that she does not drink alcohol and does not use drugs.  No Known Allergies  Family History  Problem Relation Age of Onset   Cancer Mother      Prior to Admission medications   Medication Sig Start Date End Date Taking? Authorizing Provider  levothyroxine (SYNTHROID, LEVOTHROID) 75 MCG tablet Take 75 mcg by mouth daily. 11/20/17  Yes [provider]  lisinopril (ZESTRIL) 5 MG tablet Take 5 mg by mouth daily.  07/31/19  Yes [provider]  donepezil (ARICEPT) 10 MG tablet Take 1 tablet (10 mg total) by mouth at bedtime. Patient not taking: Reported on 12/28/2020 05/03/16   Penni Bombard, MD    Physical Exam: Vitals:   12/28/20 1915 12/28/20 2000 12/28/20 2200 12/28/20 2210  BP: (!) 147/84 103/64 (!) 165/91   Pulse: (!) 50 (!) 51 (!) 54   Resp: '16 16 15   '$ Temp:    (!) 95.2 F (35.1 C)  TempSrc:    Rectal  SpO2: 100% 100% 100%     Constitutional: Patient is extremely lethargic and nonverbal.  Patient does not appear to be in distress.  Temporal wasting noted. Skin: no rashes, no lesions, poor skin turgor noted. Eyes: Pupils are equally reactive to light.  No evidence of scleral icterus or conjunctival pallor.  ENMT: Dry mucous membranes noted.  Posterior pharynx clear of any exudate or lesions.   Neck: normal, supple, no masses, no thyromegaly.  No evidence of jugular venous distension.   Respiratory: Mild basilar rales without evidence of wheezing.   Normal respiratory effort. No accessory muscle use.  Cardiovascular: Bradycardic rate with regular rhythm, no murmurs / rubs / gallops. No extremity edema. 2+ pedal pulses. No carotid bruits.  Chest:   Nontender without crepitus or deformity.   Back:   Nontender without crepitus or deformity. Abdomen: Abdomen is soft and nontender.  No evidence of intra-abdominal masses.  Positive bowel sounds noted in all quadrants.   Musculoskeletal: Notable poor muscle tone.  No joint deformity upper and lower extremities. Good ROM, no contractures.  Neurologic: Patient is extremely lethargic and occasionally responsive to verbal stimuli.  Patient very infrequently follows commands.  Patient is nonverbal.  Patient does localize to pain.   Psychiatric: Unable to assess due to severe lethargy.  Patient currently does not possess insight as to her current situation.    Labs on Admission: I have personally reviewed following labs and imaging studies  -   CBC: Recent Labs  Lab 12/28/20 1612  WBC 5.6  NEUTROABS 4.2  HGB 15.1*  HCT 47.1*  MCV 93.5  PLT 123XX123   Basic Metabolic Panel: Recent Labs  Lab 12/28/20 1612  NA 138  K 4.1  CL 105  CO2 25  GLUCOSE 87  BUN 9  CREATININE 0.89  CALCIUM 9.5   GFR: CrCl cannot be calculated (Unknown ideal weight.). Liver Function Tests: Recent Labs  Lab 12/28/20 1612  AST 27  ALT 16  ALKPHOS 64  BILITOT 0.6  PROT 6.7  ALBUMIN 3.3*   No results for input(s): LIPASE, AMYLASE in the last 168 hours. No results for input(s): AMMONIA in the last 168 hours. Coagulation Profile: No results for input(s): INR, PROTIME in the last 168 hours. Cardiac Enzymes: Recent Labs  Lab 12/28/20 1612  CKTOTAL 120   BNP (last 3 results) No results for input(s): PROBNP in the last 8760 hours. HbA1C: No results for input(s): HGBA1C in the last 72 hours. CBG: Recent Labs  Lab 12/28/20 1538 12/28/20 1711 12/28/20 1912  GLUCAP 55* 145* 78   Lipid Profile: No results  for input(s): CHOL, HDL, LDLCALC, TRIG, CHOLHDL, LDLDIRECT in the last 72 hours. Thyroid Function Tests: Recent Labs    12/28/20 1801  TSH 45.405*  FREET4 0.74   Anemia Panel: No results for input(s): VITAMINB12, FOLATE, FERRITIN, TIBC, IRON, RETICCTPCT in the last 72 hours. Urine analysis:    Component Value Date/Time   COLORURINE YELLOW 12/28/2020 1944   APPEARANCEUR CLEAR 12/28/2020 1944   LABSPEC 1.006 12/28/2020 1944   PHURINE 7.0 12/28/2020 1944   GLUCOSEU NEGATIVE 12/28/2020 1944   HGBUR NEGATIVE 12/28/2020 1944   BILIRUBINUR NEGATIVE 12/28/2020 1944   KETONESUR NEGATIVE 12/28/2020 1944   PROTEINUR NEGATIVE 12/28/2020 1944   NITRITE NEGATIVE 12/28/2020 1944   LEUKOCYTESUR NEGATIVE 12/28/2020 1944    Radiological Exams on Admission - Personally Reviewed: CT HEAD WO CONTRAST (5MM)  Result Date: 12/28/2020 CLINICAL DATA:  Delirium, syncope EXAM: CT HEAD WITHOUT CONTRAST TECHNIQUE: Contiguous axial images  were obtained from the base of the skull through the vertex without intravenous contrast. COMPARISON:  11/16/2020, 10/08/2019 FINDINGS: Brain: There is moderate parenchymal volume loss, stable since prior examination. Moderate ventriculomegaly is present also unchanged from prior examination. Moderate periventricular white matter changes are present likely reflecting the sequela of small vessel ischemia. No acute intracranial hemorrhage or infarct. No abnormal mass effect or midline shift. No abnormal intra or or extra-axial mass lesion or fluid collection. The cerebellum is unremarkable. Vascular: No asymmetric hyperdense vasculature at the skull base. Skull: Intact Sinuses/Orbits: Frothy secretions noted within the left sphenoid sinus, similar to prior examination. Remaining paranasal sinuses are clear. The orbits are unremarkable. Other: Mastoid air cells and middle ear cavities are clear. IMPRESSION: Stable moderate parenchymal atrophic changes and periventricular white matter changes likely reflecting the sequela of small vessel ischemia. No acute intracranial abnormality. Electronically Signed   By: Fidela Salisbury M.D.   On: 12/28/2020 17:07   DG Chest Portable 1 View  Result Date: 12/28/2020 CLINICAL DATA:  Near syncopal episode, initial encounter EXAM: PORTABLE CHEST 1 VIEW COMPARISON:  12/23/2008 FINDINGS: Cardiac shadow is within normal limits. Nipple shadows are seen. No focal infiltrate or sizable effusion is noted. No acute bony abnormality is seen. IMPRESSION: No active disease. Electronically Signed   By: Inez Catalina M.D.   On: 12/28/2020 17:12    EKG: Personally reviewed.  Rhythm is severe sinus bradycardia with heart rate of 44 bpm with PACs.   Assessment/Plan Principal Problem:   Grand mal seizure [facility])  Patient initially exhibited a episode of sudden change in sensorium in the home setting with with the daughter described a sudden difficulty breathing.  This episode spontaneously  resolved after EMS arrival.  Shortly after arrival patient exhibited a episode of approximately 1 to 2 minutes of tonic-clonic seizure activity.  It is very likely that the episode reported in the home was another episode of seizure activity. Profound Alzheimer's dementia very well may be the culprit of patient's new onset seizures Patient is already been loaded with intravenous Keppra by the emergency department provider.  Case was discussed with Dr. Curly Shores with neurology who will come provide recommendations, their input is appreciated.   We will transition patient to intravenous Keppra 500 mg twice daily unless dose adjusted otherwise by neurology. Per my discussion with the daughter, she wishes to discuss patient's case further with neurology with her father present before deciding to proceed with any further diagnostics such as MRI imaging or EEG. Gentle intravenous hydration correcting hypoglycemia and monitoring for recurrence Seizure precautions  Active Problems: Severe  hypothyroidism  Patient presents with longstanding history of hypothyroidism status post remote history of thyroidectomy Daughter reports patient has been compliant with her Synthroid therapy although it is unclear when her TSH was last checked Upon evaluation here patient's TSH is markedly elevated at 45 although free T4 is unremarkable Uncontrolled hypothyroidism is likely contributing to patient's bradycardia, hypothermia and hypoglycemia on this presentation. Due to markedly elevated TSH we will initiate intravenous levothyroxine.  We will go ahead and provide 50 mcg which would be equivalent of 100 p.o. (an increase from patients usual 45mg Qdaily that she takes at home).      Late onset Alzheimer's dementia without behavioral disturbance ([place]  Diagnosed 2010 Extremely advanced disease with a overall poor prognosis After lengthy discussion with the daughter they are open to living in comfort care/hospice option  prior to discharge. Palliative care consultation placed  Essential hypertension   Holding home regimen of oral lisinopril PRN Intravenous antihypertensives for markedly elevated blood pressure.    Sinus bradycardia  Patient initially presented with heart rates as low as the 40s with initial concern by the ER provider for developing pleat heart block ER provider reviewed EKGs with on-call cardiology who did not believe that heart block was present Considering substantial hypothyroidism in this patient with a TSH of 45, that is likely contributing to the patient's bradycardia. Patient is hemodynamically stable at this time Monitoring patient on telemetry    Hypothermia  Likely secondary to severe hypothyroidism ER provider initiated [facility] will monitor closely for any evidence of hypotension overnight    Lab test positive for detection of COVID-19 virus  According to daughter patient tested positive on July 7 along with rest of the family. Minimal symptoms at the time Patient was found to have positive COVID PCR testing here although per my discussion with the lab, CT level on the sample is 42 making this most likely a residual positive test from patient's previous infection. It is very unlikely that the patient is infectious at this time.  Isolation will not be initiated.    Goals of care, counseling/discussion  Had the opportunity have a lengthy discussion about the patient's condition, plan of care and goals of care with the daughter via phone conversation. I additionally attempted to contact the husband but did not receive an answer I informed the daughter of the patient's overall extremely poor prognosis due to advanced Alzheimer's dementia Daughter states that she and her father were both open to the patient becoming comfort measures and going home with hospice and wishes to meet with palliative care prior to discharge. Palliative care consultation order placed.     Hypoglycemia  Likely secondary to poor oral intake and hypothyroidism D5 normal saline initiated Serial Accu-Cheks to monitor for further bouts of hypoglycemia   Code Status:  DNR Family Communication: Plan of care discussed at length with daughter via phone conversation  Status is: Inpatient  Remains inpatient appropriate because:Ongoing diagnostic testing needed not appropriate for outpatient work up, IV treatments appropriate due to intensity of illness or inability to take PO, and Inpatient level of care appropriate due to severity of illness  Dispo: The patient is from: Home              Anticipated d/c is to:  Home with hospice              Patient currently is medically stable to d/c.   Difficult to place patient No  Vernelle Emerald MD [facility] Pager [phone]  If 7PM-7AM, please contact night-coverage [email or web address] Use universal [place] password for that web site. If you do not have the password, please call the hospital operator.  12/28/2020, 11:02 PM
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
## 2020-12-28 NOTE — ED Provider Notes (Signed)
[facility] EMERGENCY DEPARTMENT Provider Note   CSN: [id number] Arrival date & time: 12/28/20  1451     History No chief complaint on file.   Beverly Edwards is a 75 y.o. female.  This is 75 yo female, hx as below including dementia, nonverbal. Presenting to the ED 2/2 AMS. Patient unable to provide and history. EMS reports patient with abnormal breathing on arrival, bagged with ambubag and given narcan as pupils appeared small. Pt agitated en-route with ambulance. POC glucose on arrival was WNL. Arrived on NRB.   D/w family [place] Spouse [phone]   [employer] Daughter     [phone] . Daughter reports she was helping patient in the shower, patient fell to the ground in the fetal position, she was acting abnormally, unable to stand back up, gurgling sounds, ?rhythmic movements. She was gurgling, ?emesis. She called EMS and they brought her to the ED. Daughter reports no recent medication changes, normal state of health prior to this event. No seizure like activity reported by daughter. Reports patient with difficulty eating over the last few weeks, difficulty using her right arm.   Level 5 caveat, AMS, nonverbal  The history is provided by the spouse, a relative and a caregiver. No language interpreter was used.      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[place]) 09/20/2016   Pain due to onychomycosis of nail 06/21/2016    Past Surgical History:  Procedure Laterality Date   BUNIONECTOMY     CARPAL TUNNEL  RELEASE Right    FOOT FRACTURE SURGERY     MYOMECTOMY       OB History     Gravida      Para      Term      Preterm      AB      Living  1      SAB      IAB      Ectopic      Multiple      Live Births              Family History  Problem Relation Age of Onset   Cancer Mother     Social History   Tobacco Use   Smoking status: Never   Smokeless tobacco: Never  Vaping Use   Vaping Use: Never used  Substance Use Topics   Alcohol use: No    Alcohol/week: 0.0 standard drinks    Comment: No Longer since 2004   Drug use: No    Home Medications Prior to Admission medications   Medication Sig Start Date End Date Taking? Authorizing Provider  levothyroxine (SYNTHROID, LEVOTHROID) 75 MCG tablet Take 75 mcg by mouth daily. 11/20/17  Yes [provider]  lisinopril (ZESTRIL) 5 MG tablet Take 5 mg by mouth daily. 07/31/19  Yes [provider]  donepezil (ARICEPT) 10 MG tablet Take 1 tablet (10 mg total) by mouth at bedtime. Patient  not taking: Reported on 12/28/2020 05/03/16   Penni Bombard, MD    Allergies    Patient has no known allergies.  Review of Systems   Review of Systems  Unable to perform ROS: Mental status change   Physical Exam Updated Vital Signs BP 103/64   Pulse (!) 51   Temp (!) 94 F (34.4 C) (Rectal)   Resp 16   SpO2 100%   Physical Exam Constitutional:      General: She is not in acute distress.    Appearance: She is not ill-appearing.     Comments: Frail appearing  HENT:     Head: Normocephalic and atraumatic.     Right Ear: External ear normal.     Left Ear: External ear normal.     Nose: Nose normal.     Mouth/Throat:     Mouth: Mucous membranes are dry.  Eyes:     General: No scleral icterus.    Pupils: Pupils are equal, round, and reactive to light.  Cardiovascular:     Rate and Rhythm: Normal rate and regular rhythm.     Pulses: Normal pulses.  Pulmonary:     Effort: Pulmonary effort is  normal. No respiratory distress.     Breath sounds: Normal breath sounds.  Abdominal:     General: Abdomen is flat.     Palpations: Abdomen is soft.     Tenderness: There is no abdominal tenderness.  Musculoskeletal:     Cervical back: Normal range of motion.     Right lower leg: No edema.     Left lower leg: No edema.  Skin:    General: Skin is warm and dry.     Capillary Refill: Capillary refill takes 2 to 3 seconds.  Neurological:     GCS: GCS eye subscore is 4. GCS verbal subscore is 1. GCS motor subscore is 5.     Comments: Moving extremities spontaneously, noncompliant with neurological exam.     ED Results / Procedures / Treatments   Labs (all labs ordered are listed, but only abnormal results are displayed) Labs Reviewed  CBC WITH DIFFERENTIAL/PLATELET - Abnormal; Notable for the following components:      Result Value   Hemoglobin 15.1 (*)    HCT 47.1 (*)    All other components within normal limits  COMPREHENSIVE METABOLIC PANEL - Abnormal; Notable for the following components:   Albumin 3.3 (*)    All other components within normal limits  TSH - Abnormal; Notable for the following components:   TSH 45.405 (*)    All other components within normal limits  CBG MONITORING, ED - Abnormal; Notable for the following components:   Glucose-Capillary 55 (*)    All other components within normal limits  CBG MONITORING, ED - Abnormal; Notable for the following components:   Glucose-Capillary 145 (*)    All other components within normal limits  RESP PANEL BY RT-PCR (FLU A&B, COVID) ARPGX2  URINALYSIS, ROUTINE W REFLEX MICROSCOPIC  CK  T4, FREE  CORTISOL  CBG MONITORING, ED  TROPONIN I (HIGH SENSITIVITY)  TROPONIN I (HIGH SENSITIVITY)    EKG EKG Interpretation  Date/Time:  Sunday December 28 2020 18:43:02 EDT Ventricular Rate:  44 PR Interval:  148 QRS Duration: 101 QT Interval:  480 QTC Calculation: 411 R Axis:   83 Text Interpretation: Sinus bradycardia  Atrial premature complexes in couplets Possible CHB Confirmed by Wynona Dove ([id number]) on 12/28/2020 7:31:18 PM  Radiology CT HEAD WO CONTRAST (5MM)  Result Date: 12/28/2020 CLINICAL DATA:  Delirium, syncope EXAM: CT HEAD WITHOUT CONTRAST TECHNIQUE: Contiguous axial images were obtained from the base of the skull through the vertex without intravenous contrast. COMPARISON:  11/16/2020, 10/08/2019 FINDINGS: Brain: There is moderate parenchymal volume loss, stable since prior examination. Moderate ventriculomegaly is present also unchanged from prior examination. Moderate periventricular white matter changes are present likely reflecting the sequela of small vessel ischemia. No acute intracranial hemorrhage or infarct. No abnormal mass effect or midline shift. No abnormal intra or or extra-axial mass lesion or fluid collection. The cerebellum is unremarkable. Vascular: No asymmetric hyperdense vasculature at the skull base. Skull: Intact Sinuses/Orbits: Frothy secretions noted within the left sphenoid sinus, similar to prior examination. Remaining paranasal sinuses are clear. The orbits are unremarkable. Other: Mastoid air cells and middle ear cavities are clear. IMPRESSION: Stable moderate parenchymal atrophic changes and periventricular white matter changes likely reflecting the sequela of small vessel ischemia. No acute intracranial abnormality. Electronically Signed   By: Fidela Salisbury M.D.   On: 12/28/2020 17:07   DG Chest Portable 1 View  Result Date: 12/28/2020 CLINICAL DATA:  Near syncopal episode, initial encounter EXAM: PORTABLE CHEST 1 VIEW COMPARISON:  12/23/2008 FINDINGS: Cardiac shadow is within normal limits. Nipple shadows are seen. No focal infiltrate or sizable effusion is noted. No acute bony abnormality is seen. IMPRESSION: No active disease. Electronically Signed   By: Inez Catalina M.D.   On: 12/28/2020 17:12    Procedures Procedures   Medications Ordered in ED Medications  atropine  injection 0.4 mg (0.4 mg Intravenous Not Given 12/28/20 1920)  dextrose 5 %-0.9 % sodium chloride infusion ( Intravenous New Bag/Given 12/28/20 2000)  naloxone [facility]) nasal spray 4 mg/0.1 mL (1 spray Nasal Provided for home use 12/28/20 1544)  dextrose 50 % solution 50 mL (50 mLs Intravenous Given 12/28/20 1605)  lactated ringers bolus 1,000 mL (0 mLs Intravenous Stopped 12/28/20 1921)  levETIRAcetam (KEPPRA) IVPB 1000 mg/100 mL premix (0 mg Intravenous Stopped 12/28/20 2004)    ED Course  I have reviewed the triage vital signs and the nursing notes.  Pertinent labs & imaging results that were available during my care of the patient were reviewed by me and considered in my medical decision making (see chart for details).    MDM Rules/Calculators/A&P                          75 yo female with hx dementia presents to ED 2/2 AMS, collapse. Patient non-verbal at baseline. History provided by family over telephone. Patient is DNR, confirmed with DPOA. Vital signs reviewed, she is hypothermic and bair hugger/blankets applied. Serious etiology considered.  POC glucose is low, given amp of d50. IVF. She does not have hx of DM. Labs otherwise are stable.   Imaging obtained, CTH and CXR which is non-acute.   Reassessed patient, telemetry with bradycardia, artifact present, will d/w cardiology. Pacing pads to bedside > d/w cardiology, ?pacing artifact but unsure, no complete heart block. They will evaluate the patient at bedside.    Called to bedside as patient actively seizing, tonic clonic. Seizure activity resolved after <2-3 minutes. Seizure precautions ordered. Will load with Keppra. Patient protecting her airway, she appears post-ictal. Pulse ox is stable on [place] 4L. No emesis. Concern that event that occurred just PTA was in fact a seizure.   D/w neurology, agree with keppra, they will eval the pt.    Recommend admission at this time  with neuro eval. Would also likely benefit from  hospice/palliative eval.         CRITICAL CARE Performed by: Jeanell Sparrow   Total critical care time: 32 minutes  Critical care time was exclusive of separately billable procedures and treating other patients.  Critical care was necessary to treat or prevent imminent or life-threatening deterioration.  Critical care was time spent personally by me on the following activities: development of treatment plan with patient and/or surrogate as well as nursing, discussions with consultants, evaluation of patient's response to treatment, examination of patient, obtaining history from patient or surrogate, ordering and performing treatments and interventions, ordering and review of laboratory studies, ordering and review of radiographic studies, pulse oximetry and re-evaluation of patient's condition.   Final Clinical Impression(s) / ED Diagnoses Final diagnoses:  Seizure ([place])  Bradycardia  Dementia with behavioral disturbance, unspecified dementia type [facility])    Rx / DC Orders ED Discharge Orders     None        Jeanell Sparrow, DO 12/28/20 2041

## 2020-12-28 NOTE — ED Notes (Signed)
Unable to get a oxygen level on patient. Dr. Wynona Dove made aware and at bedside to exam patient. This RN and MD attempted to get a oxygen level with no success. Dr. Pearline Cables aware no oxygen level showing up.

## 2020-12-29 DIAGNOSIS — F0391 Unspecified dementia with behavioral disturbance: Secondary | ICD-10-CM

## 2020-12-29 DIAGNOSIS — F03918 Unspecified dementia, unspecified severity, with other behavioral disturbance: Secondary | ICD-10-CM

## 2020-12-29 LAB — GLUCOSE, CAPILLARY
Glucose-Capillary: 80 mg/dL (ref 70–99)
Glucose-Capillary: 79 mg/dL (ref 70–99)
Glucose-Capillary: 65 mg/dL — ABNORMAL LOW (ref 70–99)
Glucose-Capillary: 63 mg/dL — ABNORMAL LOW (ref 70–99)

## 2020-12-29 LAB — MAGNESIUM: Magnesium: 2 mg/dL (ref 1.7–2.4)

## 2020-12-29 LAB — COMPREHENSIVE METABOLIC PANEL
ALT: 15 U/L (ref 0–44)
AST: 25 U/L (ref 15–41)
Albumin: 2.9 g/dL — ABNORMAL LOW (ref 3.5–5.0)
Alkaline Phosphatase: 59 U/L (ref 38–126)
Anion gap: 7 (ref 5–15)
BUN: 7 mg/dL — ABNORMAL LOW (ref 8–23)
CO2: 25 mmol/L (ref 22–32)
Calcium: 9 mg/dL (ref 8.9–10.3)
Chloride: 105 mmol/L (ref 98–111)
Creatinine, Ser: 0.85 mg/dL (ref 0.44–1.00)
GFR, Estimated: >60 mL/min (ref >60)
Glucose, Bld: 96 mg/dL (ref 70–99)
Potassium: 4.5 mmol/L (ref 3.5–5.1)
Sodium: 137 mmol/L (ref 135–145)
Total Bilirubin: 0.6 mg/dL (ref 0.3–1.2)
Total Protein: 5.8 g/dL — ABNORMAL LOW (ref 6.5–8.1)

## 2020-12-29 LAB — CBC WITH DIFFERENTIAL/PLATELET
Abs Immature Granulocytes: 0.02 10*3/uL (ref 0.00–0.07)
Basophils Absolute: 0 10*3/uL (ref 0.0–0.1)
Basophils Relative: 0 %
Eosinophils Absolute: 0 10*3/uL (ref 0.0–0.5)
Eosinophils Relative: 0 %
HCT: 38.4 % (ref 36.0–46.0)
Hemoglobin: 12.8 g/dL (ref 12.0–15.0)
Immature Granulocytes: 0 %
Lymphocytes Relative: 21 %
Lymphs Abs: 1.1 10*3/uL (ref 0.7–4.0)
MCH: 30.2 pg (ref 26.0–34.0)
MCHC: 33.3 g/dL (ref 30.0–36.0)
MCV: 90.6 fL (ref 80.0–100.0)
Monocytes Absolute: 0.3 10*3/uL (ref 0.1–1.0)
Monocytes Relative: 5 %
Neutro Abs: 3.7 10*3/uL (ref 1.7–7.7)
Neutrophils Relative %: 74 %
Platelets: 238 10*3/uL (ref 150–400)
RBC: 4.24 MIL/uL (ref 3.87–5.11)
RDW: 13.9 % (ref 11.5–15.5)
WBC: 5.1 10*3/uL (ref 4.0–10.5)
nRBC: 0 % (ref 0.0–0.2)

## 2020-12-29 LAB — PHOSPHORUS: Phosphorus: 2.9 mg/dL (ref 2.5–4.6)

## 2020-12-29 LAB — CBG MONITORING, ED: Glucose-Capillary: 72 mg/dL (ref 70–99)

## 2020-12-29 MED ORDER — SODIUM CHLORIDE 0.9 % IV SOLN
300.0000 mg | Freq: Two times a day (BID) | INTRAVENOUS | Status: DC
  Administered 2020-12-29 – 2020-12-31 (×5): 300 mg via INTRAVENOUS
  Filled 2020-12-29 (×6): qty 3

## 2020-12-29 MED ORDER — DEXTROSE 50 % IV SOLN
12.5000 g | INTRAVENOUS | Status: AC
  Administered 2020-12-29: 12.5 g via INTRAVENOUS
  Filled 2020-12-29: qty 50

## 2020-12-29 MED ORDER — DEXTROSE 10 % IV SOLN: INTRAVENOUS | Status: DC

## 2020-12-29 NOTE — ED Notes (Signed)
Report attempted multiple times, per secretary no nurse is able to get report at this time and we need to wait until nurse can call us back. ED CN and house coverage notified.

## 2020-12-29 NOTE — ED Notes (Signed)
Report attempted, RN to call back. 

## 2020-12-29 NOTE — Progress Notes (Addendum)
[facility] PROGRESS NOTE    Beverly Edwards  [id number] DOB: 1945-12-08 DOA: 12/28/2020 PCP: Harlan Stains, MD      Brief Narrative:  Beverly Edwards is a 75 y.o. F with advanced dementia, PVD, hypothyroidism and HTN who presented with an episode of decreased consciousness.  At baseline, the patient is nonverbal, does not self-feed, and is increasingly unsteady with ambulation due to her dementia per Neurology notes.     On the day of admission, patient was being bathed when she collapsed and appeared to be foaming at the mouth, less responsive and having difficulty breathing.   EMS brought her to the ER and by that time she appeared improving until she had another episode that looked like a GTC seizure.    She was given Keppra, Neurology and palliative care were consulted.             Assessment & Plan: Seizure No further seizure activity here - Continue Keppra   End stage alzheimers Failure to thrive Severe protein calorie malnutrition The patient has very poor functional status, severe loss of subcutaneous muscle mass and fat diffusely and reported limited oral intake.  It is my medical opinion that her Alzheimer's has reached end stage and she is likely to have a prognosis less than 6 months. - Consult Palliative Care   Hypothyroidism Agree, not myxedema. Difficult to tell the extent to which thyroid deficiency affects her functional status, which is so limited to begin with.  TSH elevated but T4 normal.   - Continue IV levothyroxine, titrate dose at discharge  Recent COVID Resolved, no isolation required.       Disposition: Status is: Inpatient  Remains inpatient appropriate because: she requires ongoing IV therapies, and with her advanced alzheimers it is unclear the effect of premature discharge, but she is close to the end of life and requires ongoing evaluation and discusion of goals of care  Dispo: The patient is from: Home               Anticipated d/c is to:  home with hospice              Patient currently is not medically stable to d/c.   Difficult to place patient No       Level of care: Telemetry Medical       MDM: The below labs and imaging reports were reviewed and summarized above.  Medication management as above.    DVT prophylaxis: enoxaparin (LOVENOX) injection 40 mg Start: 12/29/20 1000  Code Status: DNR    Consultants:  Palliative Care Neurology          Subjective: Patient is nonverbal.  Nursing report incontinence, no seizures, no fever, no respiratory distress, no vomiting.  Objective: Vitals:   12/29/20 1200 12/29/20 1301 12/29/20 1343 12/29/20 1605  BP:  138/79 107/89 137/77  Pulse:  (!) 56 (!) 49 (!) 43  Resp:  17  18  Temp: 98 F (36.7 C)  (!) 97.4 F (36.3 C) 98 F (36.7 C)  TempSrc:   Oral   SpO2: 93% 99% 100% 99%    Intake/Output Summary (Last 24 hours) at 12/29/2020 1726 Last data filed at 12/29/2020 1252 Gross per 24 hour  Intake 1188.6 ml  Output --  Net 1188.6 ml   There were no vitals filed for this visit.  Examination: General appearance: Cachectic adult female, confused, lying in bed.   HEENT: Anicteric, conjunctiva pink, lids and lashes normal for age.  No nasal deformity, discharge, epistaxis.  Lipsdry edentulous, OP dry, no oral lesions although patient does not open mouth.   Skin: Warm and dry.  Tenting noted. No suspicious rashes or lesions. Cardiac: RRR, nl S1-S2, no murmurs appreciated.  No LE edema.  Radial  pulses 2+ and symmetric. Respiratory: Normal respiratory rate and rhythm.  CTAB without rales or wheezes. Abdomen: Abdomen soft.  No grimace to palpation.  No ascites, distension, hepatosplenomegaly.   MSK: No deformities or effusions.Severe cachecxia and diffuse loss of muscle mass and fat noted. Neuro: Awake but inattentive, makes no spontaneous verbalizations.  Does not follow ocmmands.  Seems to have normal tone in upper  extremities, legs somewhat contractured.  Psych:     Data Reviewed: I have personally reviewed following labs and imaging studies:  CBC: Recent Labs  Lab 12/28/20 1612 12/29/20 1457  WBC 5.6 5.1  NEUTROABS 4.2 3.7  HGB 15.1* 12.8  HCT 47.1* 38.4  MCV 93.5 90.6  PLT 279 [id number]   Basic Metabolic Panel: Recent Labs  Lab 12/28/20 0535 12/28/20 1612 12/29/20 0535  NA  --  138 137  K  --  4.1 4.5  CL  --  105 105  CO2  --  25 25  GLUCOSE  --  87 96  BUN  --  9 7*  CREATININE  --  0.89 0.85  CALCIUM  --  9.5 9.0  MG  --   --  2.0  PHOS 2.9  --   --    GFR: CrCl cannot be calculated (Unknown ideal weight.). Liver Function Tests: Recent Labs  Lab 12/28/20 1612 12/29/20 0535  AST 27 25  ALT 16 15  ALKPHOS 64 59  BILITOT 0.6 0.6  PROT 6.7 5.8*  ALBUMIN 3.3* 2.9*   No results for input(s): LIPASE, AMYLASE in the last 168 hours. No results for input(s): AMMONIA in the last 168 hours. Coagulation Profile: No results for input(s): INR, PROTIME in the last 168 hours. Cardiac Enzymes: Recent Labs  Lab 12/28/20 1612  CKTOTAL 120   BNP (last 3 results) No results for input(s): PROBNP in the last 8760 hours. HbA1C: No results for input(s): HGBA1C in the last 72 hours. CBG: Recent Labs  Lab 12/28/20 1711 12/28/20 1912 12/29/20 1242 12/29/20 1443 12/29/20 1605  GLUCAP 145* 78 72 80 79   Lipid Profile: No results for input(s): CHOL, HDL, LDLCALC, TRIG, CHOLHDL, LDLDIRECT in the last 72 hours. Thyroid Function Tests: Recent Labs    12/28/20 1801  TSH 45.405*  FREET4 0.74   Anemia Panel: No results for input(s): VITAMINB12, FOLATE, FERRITIN, TIBC, IRON, RETICCTPCT in the last 72 hours. Urine analysis:    Component Value Date/Time   COLORURINE YELLOW 12/28/2020 1944   APPEARANCEUR CLEAR 12/28/2020 1944   LABSPEC 1.006 12/28/2020 1944   PHURINE 7.0 12/28/2020 1944   GLUCOSEU NEGATIVE 12/28/2020 1944   HGBUR NEGATIVE 12/28/2020 1944   BILIRUBINUR  NEGATIVE 12/28/2020 1944   KETONESUR NEGATIVE 12/28/2020 1944   PROTEINUR NEGATIVE 12/28/2020 1944   NITRITE NEGATIVE 12/28/2020 1944   LEUKOCYTESUR NEGATIVE 12/28/2020 1944   Sepsis Labs: '@LABRCNTIP'$ (procalcitonin:4,lacticacidven:4)  ) Recent Results (from the past 240 hour(s))  Resp Panel by RT-PCR (Flu A&B, Covid) Nasopharyngeal Swab     Status: Abnormal   Collection Time: 12/28/20  9:10 PM   Specimen: Nasopharyngeal Swab; Nasopharyngeal(NP) swabs in vial transport medium  Result Value Ref Range Status   SARS Coronavirus 2 by RT PCR POSITIVE (A) NEGATIVE Final    Comment:  RESULT CALLED TO, READ BACK BY AND VERIFIED WITH: Irena Reichmann RN, AT 2210 12/28/20 D. VANHOOK (NOTE) SARS-CoV-2 target nucleic acids are DETECTED.  The SARS-CoV-2 RNA is generally detectable in upper respiratory specimens during the acute phase of infection. Positive results are indicative of the presence of the identified virus, but do not rule out bacterial infection or co-infection with other pathogens not detected by the test. Clinical correlation with patient history and other diagnostic information is necessary to determine patient infection status. The expected result is Negative.  Fact Sheet for Patients: EntrepreneurPulse.com.au  Fact Sheet for Healthcare Providers: IncredibleEmployment.be  This test is not yet approved or cleared by the [place] FDA and  has been authorized for detection and/or diagnosis of SARS-CoV-2 by FDA under an Emergency Use Authorization (EUA).  This EUA will remain in effect (meaning this te st can be used) for the duration of  the COVID-19 declaration under Section 564(b)(1) of the Act, 21 U.S.C. section 360bbb-3(b)(1), unless the authorization is terminated or revoked sooner.     Influenza A by PCR NEGATIVE NEGATIVE Final   Influenza B by PCR NEGATIVE NEGATIVE Final    Comment: (NOTE) The Xpert Xpress SARS-CoV-2/FLU/RSV plus  assay is intended as an aid in the diagnosis of influenza from Nasopharyngeal swab specimens and should not be used as a sole basis for treatment. Nasal washings and aspirates are unacceptable for Xpert Xpress SARS-CoV-2/FLU/RSV testing.  Fact Sheet for Patients: EntrepreneurPulse.com.au  Fact Sheet for Healthcare Providers: IncredibleEmployment.be  This test is not yet approved or cleared by the [place] FDA and has been authorized for detection and/or diagnosis of SARS-CoV-2 by FDA under an Emergency Use Authorization (EUA). This EUA will remain in effect (meaning this test can be used) for the duration of the COVID-19 declaration under Section 564(b)(1) of the Act, 21 U.S.C. section 360bbb-3(b)(1), unless the authorization is terminated or revoked.  Performed at [facility], [place] [address], [place], [place] [address]          Radiology Studies: CT HEAD WO CONTRAST (5MM)  Result Date: 12/28/2020 CLINICAL DATA:  Delirium, syncope EXAM: CT HEAD WITHOUT CONTRAST TECHNIQUE: Contiguous axial images were obtained from the base of the skull through the vertex without intravenous contrast. COMPARISON:  11/16/2020, 10/08/2019 FINDINGS: Brain: There is moderate parenchymal volume loss, stable since prior examination. Moderate ventriculomegaly is present also unchanged from prior examination. Moderate periventricular white matter changes are present likely reflecting the sequela of small vessel ischemia. No acute intracranial hemorrhage or infarct. No abnormal mass effect or midline shift. No abnormal intra or or extra-axial mass lesion or fluid collection. The cerebellum is unremarkable. Vascular: No asymmetric hyperdense vasculature at the skull base. Skull: Intact Sinuses/Orbits: Frothy secretions noted within the left sphenoid sinus, similar to prior examination. Remaining paranasal sinuses are clear. The orbits are unremarkable. Other:  Mastoid air cells and middle ear cavities are clear. IMPRESSION: Stable moderate parenchymal atrophic changes and periventricular white matter changes likely reflecting the sequela of small vessel ischemia. No acute intracranial abnormality. Electronically Signed   By: Fidela Salisbury M.D.   On: 12/28/2020 17:07   DG Chest Portable 1 View  Result Date: 12/28/2020 CLINICAL DATA:  Near syncopal episode, initial encounter EXAM: PORTABLE CHEST 1 VIEW COMPARISON:  12/23/2008 FINDINGS: Cardiac shadow is within normal limits. Nipple shadows are seen. No focal infiltrate or sizable effusion is noted. No acute bony abnormality is seen. IMPRESSION: No active disease. Electronically Signed   By: Inez Catalina  M.D.   On: 12/28/2020 17:12        Scheduled Meds:  enoxaparin (LOVENOX) injection  40 mg Subcutaneous Daily   levothyroxine  50 mcg Intravenous Daily   Continuous Infusions:  dextrose 5 % and 0.9% NaCl 75 mL/hr at 12/29/20 1448   levETIRAcetam Stopped (12/29/20 1155)     LOS: 1 day    Time spent: 25 minutes    Edwin Dada, MD [facility] 12/29/2020, 5:26 PM     Please page though [place] or Epic secure chat:  For [employer], Adult nurse

## 2020-12-29 NOTE — Progress Notes (Signed)
Received a call from bedside RN regarding recurrent hypoglycemia with CBGs in the low 60s.  Patient is currently NPO, reportedly due to poor cough reflex.  Speech therapist consulted for swallow evaluation and for further feeding recommendations.  Changed IV fluid from D5NS at 75 cc/h to D10W at 30 cc/h x 1 day.  Last BP 140/84, heart rate fluctuating between 35 and 52.  Twelve-lead EKG obtained.  We will continue to closely monitor and treat as indicated.

## 2020-12-29 NOTE — Consult Note (Signed)
Consultation Note Date: 12/29/2020   Patient Name: Beverly Edwards  DOB: [birth year]  MRN: [id number]  Age / Sex: 75 y.o., female  PCP: Harlan Stains, MD Referring Physician: Edwin Dada, *  Reason for Consultation: Establishing goals of care  HPI/Patient Profile: 74 y.o. female  with past medical history of Alzheimer's dementia, peripheral vascular disease, hypertension, hypothyroidism  admitted on 12/28/2020 with altered mental status.   During patient's work-up patient began to exhibit seizure-like activity witnessed by the emergency room provider. Neurology was consulted and patient was started on Keppra. Palliative medicine has been consulted to assist with goals of care conversation.  Clinical Assessment and Goals of Care:  I have reviewed medical records including EPIC notes, labs and imaging, received report from RN, assessed the patient and then met at the bedside along with patient's daughter Kennyth Lose and husband Bronston to discuss diagnosis prognosis, GOC, EOL wishes, disposition and options.  I introduced Palliative Medicine as specialized medical care for people living with serious illness. It focuses on providing relief from the symptoms and stress of a serious illness. The goal is to improve quality of life for both the patient and the family.  We discussed a brief life review of the patient and then focused on their current illness. The natural disease trajectory and expectations at EOL were discussed. Patient is originally from [place] and she has been married to [place] for 49 years. [place]onda has been nonverbal for 5-8 years and requires assistance with all ADLs. She previously enjoyed gardening and singing, going to church, but has gradually declined since her Alzheimer's diagnosis. She had become more unsteady over the past 1-2 months and her nutritional intake has decreased as well.  She does not have an assistive device, but her husband must support her with his arm when she walks. In addition to her daughter, she had a son who unfortunately passed away 03/03/2023 of last year. Her husband is her primary caregiver, feeding her and scheduling her appointments, while her daughter stays overnight after getting off work. Dr. Leonel Ramsay was able to attend the family meeting and we discussed prognosis and treatment options.  I attempted to elicit values and goals of care important to the patient.   Patient's family understand the terminal nature of her dementia diagnosis and the indications that she has progressed to end stage. Patient has not had detailed discussions of her end of life preferences with her family. Their goal is to support the patient in her familiar home environment for as long as possible. We had a detailed discussion of the misconceptions surrounding hospice, the availability of additional resources, and the difference in philosophy when considering quality of life over quantity of life.  The difference between aggressive medical intervention and comfort care was considered in light of the patient's goals of care.   Advanced directives, concepts specific to code status, artifical feeding and hydration, and rehospitalization were considered and discussed.  Hospice and Palliative Care services outpatient were explained and offered. She is currently enrolled in outpatient  palliative care.   Discussed the importance of continued conversation with family and the medical providers regarding overall plan of care and treatment options, ensuring decisions are within the context of the patient's values and GOCs.    Questions and concerns were addressed.  Hard Choices booklet left for review. The family was encouraged to call with questions or concerns.  PMT will continue to support holistically.   NEXT OF KIN is patient's husband [employer].    SUMMARY OF  RECOMMENDATIONS   -DNR confirmed -Continue supportive measures, no diagnostic studies such as EEG or MRI -Patient's family leaning towards home with hospice services at discharge. Husband needs time to consider before final decision. 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 The following aspects are pertinent.  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[employer], retired  Tobacco Use   Smoking status: Never   Smokeless tobacco: Never  Vaping Use   Vaping Use: Never used  Substance and Sexual Activity   Alcohol use: No    Alcohol/week: 0.0 standard drinks    Comment: No Longer since 2004    Drug use: No   Sexual activity: Not on file  Other Topics Concern   Not on file  Social History Narrative   07/10/2018 Lives with husband   Social Determinants of Health   Financial Resource Strain: Not on file  Food Insecurity: Not on file  Transportation Needs: Not on file  Physical Activity: Not on file  Stress: Not on file  Social Connections: Not on file   Family History  Problem Relation Age of Onset   Cancer Mother    Scheduled Meds:  enoxaparin (LOVENOX) injection  40 mg Subcutaneous Daily   levothyroxine  50 mcg Intravenous Daily   Continuous Infusions:  dextrose 5 % and 0.9% NaCl Stopped (12/29/20 1059)   levETIRAcetam     PRN Meds:.acetaminophen **OR** acetaminophen, hydrALAZINE, ondansetron **OR** ondansetron (ZOFRAN) IV, polyethylene glycol Medications Prior to Admission:  Prior to Admission medications   Medication Sig Start Date End Date Taking? Authorizing Provider  levothyroxine (SYNTHROID, LEVOTHROID) 75 MCG tablet Take 75 mcg by mouth daily. 11/20/17  Yes [provider]  lisinopril (ZESTRIL) 5 MG tablet Take 5  mg by mouth daily. 07/31/19  Yes [provider]  donepezil (ARICEPT) 10 MG tablet Take 1 tablet (10 mg total) by mouth at bedtime. Patient not taking: Reported on 12/28/2020 05/03/16   Penumalli, Earlean Polka, MD   No Known Allergies Review of Systems  Unable to perform ROS: Dementia   Physical Exam Vitals and nursing note reviewed.  Constitutional:      Appearance: She is ill-appearing.  Cardiovascular:     Rate and Rhythm: Bradycardia present.  Pulmonary:     Effort: Pulmonary effort is normal.  Neurological:     Mental Status: She is alert.     Comments: nonverbal    Vital Signs: BP 130/74   Pulse (!) 41   Temp 97.8 F (36.6 C) (Axillary)   Resp 12   SpO2 99%  Pain Scale: PAINAD       SpO2: SpO2: 99 % O2 Device:SpO2: 99 % O2 Flow Rate: .O2 Flow Rate (L/min): 3 L/min  IO: Intake/output summary:  Intake/Output  Summary (Last 24 hours) at 12/29/2020 1122 Last data filed at 12/28/2020 2004 Gross per 24 hour  Intake 1100 ml  Output --  Net 1100 ml    LBM:   Baseline Weight:   Most recent weight:       Palliative Assessment/Data:     Time In: 1:00pm Time Out: 2:30pm Time Total: 90 minutes Greater than 50% of this time was spent in counseling and coordinating care related to the above assessment and plan.  Dorthy Cooler, PA-C Palliative Medicine Team Team phone # [phone]  Thank you for allowing the Palliative Medicine Team to assist in the care of this patient. Please utilize secure chat with additional questions, if there is no response within 30 minutes please call the above phone number.  Palliative Medicine Team providers are available by phone from 7am to 7pm daily and can be reached through the team cell phone.  Should this patient require assistance outside of these hours, please call the patient's attending physician.

## 2020-12-29 NOTE — ED Notes (Signed)
When entered room pt O2 saturation was 63%. Pt did not have the nasal cannula in nose.When this tech attempted to put nasal cannula back into nose pt removed it numerous time. Pt's RN notified. RN and I then put nasal cannula back into nose and taped it to her face, to prevent pt pulling it out.

## 2020-12-29 NOTE — ED Notes (Signed)
Temp up to 97.5 ax bear hugger removed

## 2020-12-29 NOTE — Progress Notes (Signed)
Zacarias Pontes [id number] [facility]) Hospital Liaison note:  This patient is currently enrolled in [facility] outpatient-based Palliative Care. Will continue to follow for disposition.  Please call with any outpatient palliative questions or concerns.  Thank you, Lorelee Market, LPN [facility] Liaison [phone]

## 2020-12-29 NOTE — Progress Notes (Signed)
VAST consult received to obtain IV access as current IV has infiltrated.  Per Neurology MD note from yesterday, " -Please note that if IV access is lost and patient remains too somnolent to take liquid formulation, Keppra can be administered subcutaneously (reference below); also planned family meeting for 1300, 12/29/20 to discuss palliative care and transitioning to hospice". SecureChat sent to Dr. Loleta Books inquiring if IV access should still be obtained. He replied to hold off until family meeting is completed.  ER RN notified.

## 2020-12-30 LAB — BASIC METABOLIC PANEL
Anion gap: 6 (ref 5–15)
BUN: <5 mg/dL — ABNORMAL LOW (ref 8–23)
CO2: 23 mmol/L (ref 22–32)
Calcium: 8.7 mg/dL — ABNORMAL LOW (ref 8.9–10.3)
Chloride: 107 mmol/L (ref 98–111)
Creatinine, Ser: 0.68 mg/dL (ref 0.44–1.00)
GFR, Estimated: >60 mL/min (ref >60)
Glucose, Bld: 105 mg/dL — ABNORMAL HIGH (ref 70–99)
Potassium: 3.8 mmol/L (ref 3.5–5.1)
Sodium: 136 mmol/L (ref 135–145)

## 2020-12-30 LAB — GLUCOSE, CAPILLARY
Glucose-Capillary: 42 mg/dL — CL (ref 70–99)
Glucose-Capillary: 46 mg/dL — ABNORMAL LOW (ref 70–99)
Glucose-Capillary: 91 mg/dL (ref 70–99)
Glucose-Capillary: 51 mg/dL — ABNORMAL LOW (ref 70–99)

## 2020-12-30 LAB — CBC
HCT: 36.4 % (ref 36.0–46.0)
Hemoglobin: 11.8 g/dL — ABNORMAL LOW (ref 12.0–15.0)
MCH: 29.7 pg (ref 26.0–34.0)
MCHC: 32.4 g/dL (ref 30.0–36.0)
MCV: 91.7 fL (ref 80.0–100.0)
Platelets: 224 10*3/uL (ref 150–400)
RBC: 3.97 MIL/uL (ref 3.87–5.11)
RDW: 13.8 % (ref 11.5–15.5)
WBC: 4.1 10*3/uL (ref 4.0–10.5)
nRBC: 0 % (ref 0.0–0.2)

## 2020-12-30 LAB — GLUCOSE, RANDOM: Glucose, Bld: 101 mg/dL — ABNORMAL HIGH (ref 70–99)

## 2020-12-30 LAB — CORTISOL: Cortisol, Plasma: 18.6 ug/dL

## 2020-12-30 MED ORDER — DEXTROSE 50 % IV SOLN: 12.5000 g | INTRAVENOUS | Status: DC | PRN

## 2020-12-30 MED ORDER — ENSURE ENLIVE PO LIQD
237.0000 mL | Freq: Three times a day (TID) | ORAL | Status: DC
  Administered 2020-12-30 – 2020-12-31 (×3): 237 mL via ORAL

## 2020-12-30 MED ORDER — KCL IN DEXTROSE-NACL 20-5-0.9 MEQ/L-%-% IV SOLN
INTRAVENOUS | Status: DC
  Filled 2020-12-30 (×3): qty 1000

## 2020-12-30 MED ORDER — DEXTROSE 10 % IV SOLN: INTRAVENOUS | Status: DC

## 2020-12-30 MED ORDER — DEXTROSE 50 % IV SOLN
INTRAVENOUS | Status: AC
  Administered 2020-12-30: 50 mL
  Filled 2020-12-30: qty 50

## 2020-12-30 MED ORDER — GLUCOSE 4 G PO CHEW
CHEWABLE_TABLET | ORAL | Status: AC
  Filled 2020-12-30: qty 1

## 2020-12-30 MED ORDER — DEXTROSE 50 % IV SOLN
INTRAVENOUS | Status: AC
  Filled 2020-12-30: qty 50

## 2020-12-30 NOTE — Care Plan (Signed)
Goals of care have shifted to comfort and palliation.  There is a large discrepancy this morning between her finger stick glucose (60 mg/dL) and her serum glucose (110 mg/dL).  I will therefore discontinue fingersticks.  Repeat BMP in AM.  Plan for d/c tomorrow to home with Hospice.  Anticipate prognosis is short.

## 2020-12-30 NOTE — Evaluation (Signed)
Clinical/Bedside Swallow Evaluation Patient Details  Name: Beverly Edwards MRN: [id number] Date of Birth: 01/12/1946  Today's Date: 12/30/2020 Time: SLP Start Time (ACUTE ONLY): [id number] SLP Stop Time (ACUTE ONLY): 0955 SLP Time Calculation (min) (ACUTE ONLY): 30 min  Past Medical History:  Past Medical History:  Diagnosis Date   Cancer ([place]) 10/1998   r breast   Dementia ([place])    Heart murmur    History of positive PPD    Hypertension    Hypothyroid    s/p radioactive iodine   Memory loss    Osteopenia    Past Surgical History:  Past Surgical History:  Procedure Laterality Date   BUNIONECTOMY     CARPAL TUNNEL RELEASE Right    FOOT FRACTURE SURGERY     MYOMECTOMY     HPI:  75yo female admitted 12/28/20 with weakness, unresponsiveness, possible seizure. PMH: advanced dementia, PVD, hypothyroidism, HTN. Pt is nonverbal at baseline   Assessment / Plan / Recommendation Clinical Impression  Pt seen at bedside for assessment of PO readiness and determination of least restrictive diet. Pt was sleeping upon arrival of SLP, No family present. Pt awakened easily to verbal stim, but was nonverbal and unable to follow commands. Oral care was completed with suction. Oral cavity appeared to be moist, pink, and healthy. Pt has natural dentition, but is missing some teeth. After oral care, pt was given trials of ice chips, nectar thick liquid via spoon and straw, puree, and thin liquids via straw. Timely oral prep and clearance noted with audible swallow consistently. No cough response or change in voice quality observed on any texture given. Pt was unable to demonstrate ability to bite or chew solid texture. Will downgrade diet to puree and thin liquids, crush meds. Safe swallow precautions posted at [facility], RN and MD informed. SLP will follow briefly.  SLP Visit Diagnosis: Dysphagia, unspecified (R13.10)    Aspiration Risk  Mild aspiration risk;Moderate aspiration risk;Risk for inadequate  nutrition/hydration    Diet Recommendation Dysphagia 1 (Puree);Thin liquid   Liquid Administration via: Straw Medication Administration: Crushed with puree Supervision: Full supervision/cueing for compensatory strategies Compensations: Slow rate;Small sips/bites;Minimize environmental distractions Postural Changes: Seated upright at 90 degrees;Remain upright for at least 30 minutes after po intake    Other  Recommendations Oral Care Recommendations: Oral care QID Other Recommendations: Order thickener from pharmacy;Have oral suction available   Follow up Recommendations 24 hour supervision/assistance      Frequency and Duration min 1 x/week  1 week;2 weeks       Prognosis Prognosis for Safe Diet Advancement: Guarded Barriers to Reach Goals: Cognitive deficits      Swallow Study   General Date of Onset: 12/28/20 HPI: 75yo female admitted 12/28/20 with weakness, unresponsiveness, possible seizure. PMH: advanced dementia, PVD, hypothyroidism, HTN. Pt is nonverbal at baseline Type of Study: Bedside Swallow Evaluation Previous Swallow Assessment: none found Diet Prior to this Study: Regular;Thin liquids Temperature Spikes Noted: No Respiratory Status: Nasal cannula History of Recent Intubation: No Behavior/Cognition: Alert;Cooperative;Doesn't follow directions Oral Cavity Assessment: Within Functional Limits Oral Care Completed by SLP: Yes Oral Cavity - Dentition: Missing dentition Self-Feeding Abilities: Total assist Patient Positioning: Upright in bed Baseline Vocal Quality: Not observed Volitional Cough: Cognitively unable to elicit Volitional Swallow: Unable to elicit    Oral/Motor/Sensory Function Overall Oral Motor/Sensory Function: Within functional limits   Ice Chips Ice chips: Within functional limits Presentation: Spoon   Thin Liquid Thin Liquid: Within functional limits Presentation: Straw    Nectar  Thick Nectar Thick Liquid: Within functional  limits Presentation: Spoon;Straw   Honey Thick Honey Thick Liquid: Not tested   Puree Puree: Within functional limits Presentation: Spoon   Solid     Solid: Impaired Oral Phase Impairments: Poor awareness of bolus Other Comments: unable to demonstrate ability to bite or chew solid texture     Huntley Demedeiros B. Quentin Ore, [facility], [place] Speech Language Pathologist Office: [phone]  Shonna Chock 12/30/2020,10:21 AM

## 2020-12-30 NOTE — TOC Initial Note (Signed)
Transition of Care [facility]) - Initial/Assessment Note    Patient Details  Name: Beverly Edwards MRN: [id number] Date of Birth: 08-10-1945  [facility] CM/SW Contact:    Carles Collet, RN Phone Number: 12/30/2020, 1:26 PM  Clinical Narrative:        Notified by Palliative Care NP that upon consult spouse was choosing home hospice and that she made referral to [facility].  Spoke w patient's spouse, who confirmed plan for home health and is agreeable for [facility] referral. Informed spouse that he would be receiving a call from [facility].  Notified Chrislyn Edison Pace w [facility].    Anticipate nonemergency medical transport. Address on file verified.           Expected Discharge Plan: Home w Hospice Care Barriers to Discharge: Continued Medical Work up   Patient Goals and CMS Choice     Choice offered to / list presented to : NA  Expected Discharge Plan and Services Expected Discharge Plan: [place]   Discharge Planning Services: CM Consult   Living arrangements for the past 2 months: [place] Agency: Hospice and [place] Date [place]: 12/30/20 Time [place]: 1325 Representative spoke with at 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[place]) [G40.409] New onset seizure ([place]) [R56.9] Dementia with behavioral disturbance, unspecified dementia type ([place]) [F03.91] Patient Active Problem List   Diagnosis Date Noted   Dementia with behavioral disturbance ([place])    [place] mal seizure ([place]) 12/28/2020   Sinus bradycardia 12/28/2020   Hypothermia  12/28/2020   Lab test positive for detection of COVID-19 virus 12/28/2020   Goals of care, counseling/discussion 12/28/2020   Hypoglycemia 12/28/2020   Hypothyroidism 02/09/2018   Essential hypertension 02/09/2018   Foot injury, right, sequela 02/09/2018   Late onset Alzheimer's dementia without behavioral disturbance ([place]) 09/20/2016   Pain due to onychomycosis of nail 06/21/2016   PCP:  Harlan Stains, MD Pharmacy:   [id number] [id number]: [phone]: [phone]    Social Determinants of Health (SDOH) Interventions    Readmission Risk Interventions No flowsheet data found.

## 2020-12-30 NOTE — Progress Notes (Signed)
[facility] PROGRESS NOTE    Beverly MALADY  [id number] DOB: 02-Jun-1945 DOA: 12/28/2020 PCP: Harlan Stains, MD      Brief Narrative:  Beverly Edwards is a 75 Edwards.o. F with advanced dementia, PVD, hypothyroidism and HTN who presented with an episode of weakness and unresponsiveness, possible seizure.  At baseline, the patient is nonverbal, does not self-feed, and is increasingly unsteady with ambulation due to her dementia per Neurology notes.     On the day of admission, patient was being bathed when she collapsed and appeared to be foaming at the mouth, less responsive and having difficulty breathing.   EMS brought her to the ER and by that time she appeared improving until she had another episode that looked like a GTC seizure.    She was given Keppra, Neurology and palliative care were consulted.             Assessment & Plan: Possible seizure No further seizure activity - Continue Keppra   Weakness Per family report, the patient is typically able to stand with assistance, participate in self cares.  Here she is minimally responsive, too weak to stand or swallow.  The weakness is better explained by progression of dementia, given normal T4, do not suspect myxedema.  No focal signs of infection.  No focal neurological signs, do not suspect stroke or ICH.  Electroyltes and hemogram unremarkable.  - Continue IV fluids - Check cortisol  - SLP consult   End-stage Alzheimer's Failure to thrive Severe protein calorie malnutrition See note from yesterday, I suspect this is progression of patient's Alzheimer's - Consult Palliative Care   Hypoglycemia Due to FTT. - Continue dextrose infusion   Hypothyroidism See note from yesterday - Continue levothyroxine  Recent COVID Resolved, no isolation required.       Disposition: Status is: Inpatient  Remains inpatient appropriate because: she is close to the end of life and requires  ongoing evaluation and discusion of goals of care  Dispo: The patient is from: Home              Anticipated d/c is to:  home with hospice              Patient currently is not medically stable to d/c.   Difficult to place patient No     Patient presented with a seizure-like episode in the setting of end-stage Alzheimer's.  No further seizure activity has been noted, but the patient has somnolence and no oral intake.  I suspect she is withing days to weaks of passing away due to her Alzheimers.  Palliative care are involved, discussing goals of care and likely discharge to home with hospice in the next 1-2 days    Level of care: Telemetry Medical       MDM: The below labs and imaging reports were reviewed and summarized above.  Medication management as above.    DVT prophylaxis: enoxaparin (LOVENOX) injection 40 mg Start: 12/29/20 1000  Code Status: DNR    Consultants:  Palliative Care Neurology          Subjective: Patient is nonverbal.  She had some hypoglycemia overnight, but no hypothermia, fever, respiratory distress, vomiting.     Objective: Vitals:   12/29/20 2130 12/29/20 2343 12/30/20 0112 12/30/20 0434  BP: 140/84  (!) 108/56 (!) 149/74  Pulse: (!) 52  84 (!) 48  Resp: '18 16 18 18  '$ Temp: 97.6 F (36.4 C)  98.2 F (36.8 C) 97.9 F (  36.6 C)  TempSrc: Axillary  Axillary Axillary  SpO2: 91% 100% 100% 93%    Intake/Output Summary (Last 24 hours) at 12/30/2020 [id number] Last data filed at 12/30/2020 0600 Gross per 24 hour  Intake 328.6 ml  Output --  Net 328.6 ml   There were no vitals filed for this visit.  Examination: General appearance: Cachectic adult female, lying in bed, controlled, unresponsive     HEENT:    Skin: Tented, dry, no suspicious rashes or lesions Cardiac: Regular rate and rhythm, no murmurs appreciated, no lower extremity edema Respiratory: Respiratory rate and rhythm appears normal, lungs clear without rales  wheezes Abdomen: Abdomen shows no grimace to palpation, no ascites or distention. MSK: Severe diffuse loss of subcutaneous muscle mass and fat Neuro: Sleeping, does not open eyes to touch, stirs restlessly, make no spontaneous verbalizations or movements.  Does not follow commands. Psych: Unable to assess   Data Reviewed: I have personally reviewed following labs and imaging studies:  CBC: Recent Labs  Lab 12/28/20 1612 12/29/20 1457 12/30/20 0004  WBC 5.6 5.1 4.1  NEUTROABS 4.2 3.7  --   HGB 15.1* 12.8 11.8*  HCT 47.1* 38.4 36.4  MCV 93.5 90.6 91.7  PLT 279 238 [id number]   Basic Metabolic Panel: Recent Labs  Lab 12/28/20 0535 12/28/20 1612 12/29/20 0535 12/30/20 0004  NA  --  138 137 136  K  --  4.1 4.5 3.8  CL  --  105 105 107  CO2  --  '25 25 23  '$ GLUCOSE  --  87 96 105*  BUN  --  9 7* <5*  CREATININE  --  0.89 0.85 0.68  CALCIUM  --  9.5 9.0 8.7*  MG  --   --  2.0  --   PHOS 2.9  --   --   --    GFR: CrCl cannot be calculated (Unknown ideal weight.). Liver Function Tests: Recent Labs  Lab 12/28/20 1612 12/29/20 0535  AST 27 25  ALT 16 15  ALKPHOS 64 59  BILITOT 0.6 0.6  PROT 6.7 5.8*  ALBUMIN 3.3* 2.9*   No results for input(s): LIPASE, AMYLASE in the last 168 hours. No results for input(s): AMMONIA in the last 168 hours. Coagulation Profile: No results for input(s): INR, PROTIME in the last 168 hours. Cardiac Enzymes: Recent Labs  Lab 12/28/20 1612  CKTOTAL 120   BNP (last 3 results) No results for input(s): PROBNP in the last 8760 hours. HbA1C: No results for input(s): HGBA1C in the last 72 hours. CBG: Recent Labs  Lab 12/29/20 1443 12/29/20 1605 12/29/20 2000 12/29/20 2302 12/30/20 0540  GLUCAP 80 79 65* 63* 42*   Lipid Profile: No results for input(s): CHOL, HDL, LDLCALC, TRIG, CHOLHDL, LDLDIRECT in the last 72 hours. Thyroid Function Tests: Recent Labs    12/28/20 1801  TSH 45.405*  FREET4 0.74   Anemia Panel: No results for  input(s): VITAMINB12, FOLATE, FERRITIN, TIBC, IRON, RETICCTPCT in the last 72 hours. Urine analysis:    Component Value Date/Time   COLORURINE YELLOW 12/28/2020 1944   APPEARANCEUR CLEAR 12/28/2020 1944   LABSPEC 1.006 12/28/2020 1944   PHURINE 7.0 12/28/2020 1944   GLUCOSEU NEGATIVE 12/28/2020 1944   HGBUR NEGATIVE 12/28/2020 1944   BILIRUBINUR NEGATIVE 12/28/2020 1944   KETONESUR NEGATIVE 12/28/2020 1944   PROTEINUR NEGATIVE 12/28/2020 1944   NITRITE NEGATIVE 12/28/2020 1944   LEUKOCYTESUR NEGATIVE 12/28/2020 1944   Sepsis Labs: '@LABRCNTIP'$ (procalcitonin:4,lacticacidven:4)  ) Recent Results (from the past 240  hour(s))  Resp Panel by RT-PCR (Flu A&B, Covid) Nasopharyngeal Swab     Status: Abnormal   Collection Time: 12/28/20  9:10 PM   Specimen: Nasopharyngeal Swab; Nasopharyngeal(NP) swabs in vial transport medium  Result Value Ref Range Status   SARS Coronavirus 2 by RT PCR POSITIVE (A) NEGATIVE Final    Comment: RESULT CALLED TO, READ BACK BY AND VERIFIED WITH: Irena Reichmann RN, AT 2210 12/28/20 D. VANHOOK (NOTE) SARS-CoV-2 target nucleic acids are DETECTED.  The SARS-CoV-2 RNA is generally detectable in upper respiratory specimens during the acute phase of infection. Positive results are indicative of the presence of the identified virus, but do not rule out bacterial infection or co-infection with other pathogens not detected by the test. Clinical correlation with patient history and other diagnostic information is necessary to determine patient infection status. The expected result is Negative.  Fact Sheet for Patients: EntrepreneurPulse.com.au  Fact Sheet for Healthcare Providers: IncredibleEmployment.be  This test is not yet approved or cleared by the [place] FDA and  has been authorized for detection and/or diagnosis of SARS-CoV-2 by FDA under an Emergency Use Authorization (EUA).  This EUA will remain in effect (meaning  this te st can be used) for the duration of  the COVID-19 declaration under Section 564(b)(1) of the Act, 21 U.S.C. section 360bbb-3(b)(1), unless the authorization is terminated or revoked sooner.     Influenza A by PCR NEGATIVE NEGATIVE Final   Influenza B by PCR NEGATIVE NEGATIVE Final    Comment: (NOTE) The Xpert Xpress SARS-CoV-2/FLU/RSV plus assay is intended as an aid in the diagnosis of influenza from Nasopharyngeal swab specimens and should not be used as a sole basis for treatment. Nasal washings and aspirates are unacceptable for Xpert Xpress SARS-CoV-2/FLU/RSV testing.  Fact Sheet for Patients: EntrepreneurPulse.com.au  Fact Sheet for Healthcare Providers: IncredibleEmployment.be  This test is not yet approved or cleared by the [place] FDA and has been authorized for detection and/or diagnosis of SARS-CoV-2 by FDA under an Emergency Use Authorization (EUA). This EUA will remain in effect (meaning this test can be used) for the duration of the COVID-19 declaration under Section 564(b)(1) of the Act, 21 U.S.C. section 360bbb-3(b)(1), unless the authorization is terminated or revoked.  Performed at [facility], [place] [address] [place], [place] [address]          Radiology Studies: CT HEAD WO CONTRAST (5MM)  Result Date: 12/28/2020 CLINICAL DATA:  Delirium, syncope EXAM: CT HEAD WITHOUT CONTRAST TECHNIQUE: Contiguous axial images were obtained from the base of the skull through the vertex without intravenous contrast. COMPARISON:  11/16/2020, 10/08/2019 FINDINGS: Brain: There is moderate parenchymal volume loss, stable since prior examination. Moderate ventriculomegaly is present also unchanged from prior examination. Moderate periventricular white matter changes are present likely reflecting the sequela of small vessel ischemia. No acute intracranial hemorrhage or infarct. No abnormal mass effect or midline shift. No  abnormal intra or or extra-axial mass lesion or fluid collection. The cerebellum is unremarkable. Vascular: No asymmetric hyperdense vasculature at the skull base. Skull: Intact Sinuses/Orbits: Frothy secretions noted within the left sphenoid sinus, similar to prior examination. Remaining paranasal sinuses are clear. The orbits are unremarkable. Other: Mastoid air cells and middle ear cavities are clear. IMPRESSION: Stable moderate parenchymal atrophic changes and periventricular white matter changes likely reflecting the sequela of small vessel ischemia. No acute intracranial abnormality. Electronically Signed   By: Fidela Salisbury M.D.   On: 12/28/2020 17:07   DG Chest Portable 1  View  Result Date: 12/28/2020 CLINICAL DATA:  Near syncopal episode, initial encounter EXAM: PORTABLE CHEST 1 VIEW COMPARISON:  12/23/2008 FINDINGS: Cardiac shadow is within normal limits. Nipple shadows are seen. No focal infiltrate or sizable effusion is noted. No acute bony abnormality is seen. IMPRESSION: No active disease. Electronically Signed   By: Inez Catalina M.D.   On: 12/28/2020 17:12        Scheduled Meds:  enoxaparin (LOVENOX) injection  40 mg Subcutaneous Daily   levothyroxine  50 mcg Intravenous Daily   Continuous Infusions:  dextrose 5 % and 0.9 % NaCl with KCl 20 mEq/L     levETIRAcetam 300 mg (12/29/20 2302)     LOS: 2 days    Time spent: 25 minutes    Edwin Dada, MD [facility] 12/30/2020, 7:09 AM     Please page though [place] or Epic secure chat:  For [employer], Adult nurse

## 2020-12-30 NOTE — Progress Notes (Signed)
Daily Progress Note   Patient Name: Beverly Edwards       Date: 12/30/2020 DOB: [birth year]  Age: 75 y.o. MRN#: [id number] Attending Physician: Edwin Dada, * Primary Care Physician: Harlan Stains, MD Admit Date: 12/28/2020  Reason for Consultation/Follow-up: Establishing goals of care  Subjective: Medical records reviewed. Patient assessed at the bedside. She is resting comfortably.  Called patient's husband Bronston to follow up on yesterday's initial goals of care conversation. After further deliberation, he is leaning towards taking her home with hospice. We reviewed that patient's care would be assumed by a hospice provider and emphasis would be on quality of life. Discussed benefits including additional caregiver support in the home. Provided update on patient's lethargy, SLP evaluation, and likely readiness for discharge in the next 1-2 days. Bronston has decided to proceed with home hospice at this time.    Questions and concerns addressed. PMT will continue to support holistically.   Length of Stay: 2  Current Medications: Scheduled Meds:  . dextrose      . enoxaparin (LOVENOX) injection  40 mg Subcutaneous Daily  . feeding supplement  237 mL Oral TID BM  . glucose      . levothyroxine  50 mcg Intravenous Daily    Continuous Infusions: . dextrose 5 % and 0.9 % NaCl with KCl 20 mEq/L 75 mL/hr at 12/30/20 0926  . levETIRAcetam 300 mg (12/30/20 1135)    PRN Meds: acetaminophen **OR** acetaminophen, dextrose, hydrALAZINE, ondansetron **OR** ondansetron (ZOFRAN) IV, polyethylene glycol  Physical Exam Vitals and nursing note reviewed.  Constitutional:      General: She is sleeping. She is not in acute distress.    Interventions: Nasal cannula in place.   Cardiovascular:     Rate and Rhythm: Tachycardia present.  Pulmonary:     Effort: Pulmonary effort is normal.  Neurological:     Mental Status: Mental status is at baseline.            Vital Signs: BP (!) 129/94   Pulse (!) 102   Temp 97.9 F (36.6 C) (Axillary)   Resp 18   SpO2 97%  SpO2: SpO2: 97 % O2 Device: O2 Device: Nasal Cannula O2 Flow Rate: O2 Flow Rate (L/min): 3 L/min  Intake/output summary:  Intake/Output Summary (Last 24 hours) at 12/30/2020 1156 Last data  filed at 12/30/2020 0600 Gross per 24 hour  Intake 328.6 ml  Output --  Net 328.6 ml   LBM: Last BM Date: 12/29/20 Baseline Weight:   Most recent weight:         Palliative Assessment/Data: 30% at best      Patient Active Problem List   Diagnosis Date Noted  . Dementia with behavioral disturbance ([place])   . 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 Sinus bradycardia 12/28/2020  . Hypothermia 12/28/2020  . Lab test positive for detection of COVID-19 virus 12/28/2020  . Goals of care, counseling/discussion 12/28/2020  . Hypoglycemia 12/28/2020  . Hypothyroidism 02/09/2018  . Essential hypertension 02/09/2018  . Foot injury, right, sequela 02/09/2018  . Late onset Alzheimer's dementia without behavioral disturbance ([place]) 09/20/2016  . Pain due to onychomycosis of nail 06/21/2016    Palliative Care Assessment & Plan   Patient Profile: 75 y.o. female  with past medical history of Alzheimer's dementia, peripheral vascular disease, hypertension, hypothyroidism  admitted on 12/28/2020 with altered mental status.    During patient's work-up patient began to exhibit seizure-like activity witnessed by the emergency room provider. Neurology was consulted and patient was started on Keppra. Palliative medicine has been consulted to assist with goals of care conversation.  Assessment: Seizure Failure to thrive Advanced Alzheimer's  Goals of care conversation  Recommendations/Plan: Patient's husband has  made the decision to transition to hospice upon discharge. Discussed with MD, NCM, LCSW, [facility] hospital liaison  Continue current supportive measures while patient is admitted  Psychosocial and emotional support Ongoing support from PMT  Goals of Care and Additional Recommendations: Limitations on Scope of Treatment: No Diagnostics   Prognosis:  < 6 months  Discharge Planning: Home with Hospice  Total time: 25 minutes Greater than 50% of this time was spent in counseling and coordinating care related to the above assessment and plan.  Dorthy Cooler, PA-C Palliative Medicine Team Team phone # [phone]  Thank you for allowing the Palliative Medicine Team to assist in the care of this patient. Please utilize secure chat with additional questions, if there is no response within 30 minutes please call the above phone number.  Palliative Medicine Team providers are available by phone from 7am to 7pm daily and can be reached through the team cell phone.  Should this patient require assistance outside of these hours, please call the patient's attending physician.

## 2020-12-31 DIAGNOSIS — G40409 Other generalized epilepsy and epileptic syndromes, not intractable, without status epilepticus: Secondary | ICD-10-CM | POA: Diagnosis not present

## 2020-12-31 DIAGNOSIS — R569 Unspecified convulsions: Secondary | ICD-10-CM | POA: Diagnosis not present

## 2020-12-31 DIAGNOSIS — G40901 Epilepsy, unspecified, not intractable, with status epilepticus: Secondary | ICD-10-CM | POA: Diagnosis not present

## 2020-12-31 DIAGNOSIS — Z743 Need for continuous supervision: Secondary | ICD-10-CM | POA: Diagnosis not present

## 2020-12-31 DIAGNOSIS — R4182 Altered mental status, unspecified: Secondary | ICD-10-CM | POA: Diagnosis not present

## 2020-12-31 LAB — GLUCOSE, CAPILLARY: Glucose-Capillary: 69 mg/dL — ABNORMAL LOW (ref 70–99)

## 2020-12-31 LAB — BASIC METABOLIC PANEL
Anion gap: 5 (ref 5–15)
BUN: <5 mg/dL — ABNORMAL LOW (ref 8–23)
CO2: 25 mmol/L (ref 22–32)
Calcium: 8.5 mg/dL — ABNORMAL LOW (ref 8.9–10.3)
Chloride: 111 mmol/L (ref 98–111)
Creatinine, Ser: 0.71 mg/dL (ref 0.44–1.00)
GFR, Estimated: >60 mL/min (ref >60)
Glucose, Bld: 77 mg/dL (ref 70–99)
Potassium: 4.1 mmol/L (ref 3.5–5.1)
Sodium: 141 mmol/L (ref 135–145)

## 2020-12-31 MED ORDER — LEVETIRACETAM 250 MG PO TABS: 250.0000 mg | ORAL_TABLET | Freq: Two times a day (BID) | ORAL | 1 refills | Status: DC

## 2020-12-31 NOTE — Progress Notes (Signed)
Manufacturing engineer [facility]) Hospital Liaison: RN note    Notified by Transition of Care Manger of patient/family request for [facility] services at home after discharge. Chart and patient information under review by [facility] physician. Hospice eligibility pending currently.    Writer spoke with husband to initiate education related to hospice philosophy, services and team approach to care. Husband verbalized understanding of information given. Per discussion, plan is for discharge to home by PTAR.   Please send signed and completed DNR form home with patient/family. Patient will need prescriptions for discharge comfort medications.     DME needs have been discussed, patient currently has the following equipment in the home: walker.  Patient/family requests the following DME for delivery to the home: oxygen. [place] equipment manager has been notified and will contact DME provider to arrange delivery to the home. Home address has been verified and is correct in the chart. Husband is the family member to contact to arrange time of delivery.     [facility] aware of the above. Please notify [facility] when patient is ready to leave the unit at discharge. (Call [phone] or [phone] after 5pm.) [facility] information and contact numbers given to husband.      A Please do not hesitate to call with questions.   Thank you,   Farrel Gordon, RN, [place] Hospital Liaison   [phone]

## 2020-12-31 NOTE — Discharge Summary (Signed)
Physician Discharge Summary  Beverly Edwards [id number] DOB: Sep 25, 1945 DOA: 12/28/2020  PCP: Harlan Stains, MD  Admit date: 12/28/2020 Discharge date: 12/31/2020  Admitted From: home Disposition: Home with hospice  Recommendations for Outpatient Follow-up:  Follow up with hospice MD  Home Health: none Equipment/Devices: none  Discharge Condition: stable CODE STATUS: DNR Diet recommendation: comfort feeding  HPI: Per admitting MD, 75 year old female with past medical history of Alzheimer's dementia (dx approx 2010), peripheral vascular disease, hypertension, hypothyroidism who presents to [facility] emergency department via EMS after experiencing witnessed loss of consciousness in the home. Patient is nonverbal and is unable to provide history and therefore the majority the history is been obtained from the daughter via phone conversation.  Daughter explains that patient has a longstanding history of Alzheimer's dementia, diagnosed around 2010.  Patient's dementia has gradually progressed.  She is minimally verbal, requires significant assistance with oral intake and activities of daily living.  Daughter explains that particularly in the past several months patient has now required assistance with ambulation as she has significant difficulty with balance. Daughter explains that earlier in the day on 8/14 she was bathing her mother when her mother's "feet came out from under her" and patient was assisted to the ground by the daughter.  Daughter reports that the patient began foaming at the mouth.  Shortly thereafter, daughter observed that the patient was barely breathing from what sounds to be agonal respirations.  The daughter called 11 and was coached to initiate CPR due to concerns that the patient may be suffering from cardiac arrest.  Upon EMS arrival patient was found to have a pulse however temporarily required bag-valve-mask respirations.  Her respirations somewhat  improved.  A dose of Narcan was administered and the patient was brought into [facility] emergency department for further evaluation.   Upon evaluation in the emergency department, CT imaging of the brain revealed substantial atrophy.  During patient's work-up patient began to exhibit seizure-like activity witnessed by the emergency room provider.  Seizure activity, which the provider describes as tonic-clonic seemed to spontaneously abort without intervention.  ER provider feels that this is probably what happened in the home prior to being brought here.  Patient was loaded with intravenous Keppra.  Case was discussed briefly with Dr. Curly Shores with neurology who stated that they will come see the patient in consultation.  Patient was additionally found to be quite bradycardic with initial question of developing complete heart block.  ER provider then discussed case with on-call cardiology fellow who came to evaluate the patient's EKG and did not believe that the patient was in heart block.  Patient was also found to be hypoglycemic and therefore was placed on D5 normal saline infusion.  Hospitalist group was then called to assess patient for admission to the hospital.  Hospital Course / Discharge diagnoses: Principal problem Possible seizure -No further seizure activity, neurology consulted recommending Keppra.   Active problems Weakness -Per family report, the patient is typically able to stand with assistance, participate in self cares.  Here she is minimally responsive, too weak to stand or swallow. The weakness is better explained by progression of dementia, given normal T4, do not suspect myxedema.  No focal signs of infection.  No focal neurological signs, do not suspect stroke or ICH.  End-stage Alzheimer's Failure to thrive Severe protein calorie malnutrition -suspect this is progression of patient's Alzheimer's.  Palliative care consulted and followed patient while hospitalized.  She will  be discharged  home with hospice. Hypoglycemia -Due to FTT. Comfort measures at home Hypothyroidism - Continue levothyroxine Recent COVID-Resolved, no isolation required.  Sepsis ruled out   Discharge Instructions   Allergies as of 12/31/2020   No Known Allergies      Medication List     TAKE these medications    levETIRAcetam 250 MG tablet Commonly known as: Keppra Take 1 tablet (250 mg total) by mouth 2 (two) times daily.   levothyroxine 75 MCG tablet Commonly known as: SYNTHROID Take 75 mcg by mouth daily.   lisinopril 5 MG tablet Commonly known as: ZESTRIL Take 5 mg by mouth daily.       ASK your doctor about these medications    donepezil 10 MG tablet Commonly known as: ARICEPT Take 1 tablet (10 mg total) by mouth at bedtime.        Consultations: Neurology   Procedures/Studies: None   CT HEAD WO CONTRAST (5MM)  Result Date: 12/28/2020 CLINICAL DATA:  Delirium, syncope EXAM: CT HEAD WITHOUT CONTRAST TECHNIQUE: Contiguous axial images were obtained from the base of the skull through the vertex without intravenous contrast. COMPARISON:  11/16/2020, 10/08/2019 FINDINGS: Brain: There is moderate parenchymal volume loss, stable since prior examination. Moderate ventriculomegaly is present also unchanged from prior examination. Moderate periventricular white matter changes are present likely reflecting the sequela of small vessel ischemia. No acute intracranial hemorrhage or infarct. No abnormal mass effect or midline shift. No abnormal intra or or extra-axial mass lesion or fluid collection. The cerebellum is unremarkable. Vascular: No asymmetric hyperdense vasculature at the skull base. Skull: Intact Sinuses/Orbits: Frothy secretions noted within the left sphenoid sinus, similar to prior examination. Remaining paranasal sinuses are clear. The orbits are unremarkable. Other: Mastoid air cells and middle ear cavities are clear. IMPRESSION: Stable moderate  parenchymal atrophic changes and periventricular white matter changes likely reflecting the sequela of small vessel ischemia. No acute intracranial abnormality. Electronically Signed   By: Fidela Salisbury M.D.   On: 12/28/2020 17:07   DG Chest Portable 1 View  Result Date: 12/28/2020 CLINICAL DATA:  Near syncopal episode, initial encounter EXAM: PORTABLE CHEST 1 VIEW COMPARISON:  12/23/2008 FINDINGS: Cardiac shadow is within normal limits. Nipple shadows are seen. No focal infiltrate or sizable effusion is noted. No acute bony abnormality is seen. IMPRESSION: No active disease. Electronically Signed   By: Inez Catalina M.D.   On: 12/28/2020 17:12     Subjective: -alert, confused. Non verbal  Discharge Exam: BP 119/69 (BP Location: Right Arm)   Pulse (!) 52   Temp 98 F (36.7 C) (Oral)   Resp 16   SpO2 100%   General: Pt is alert, awake, not in acute distress Cardiovascular: RRR, S1/S2 +, no rubs, no gallops Respiratory: CTA bilaterally, no wheezing, no rhonchi Abdominal: Soft, NT, ND, bowel sounds +   The results of significant diagnostics from this hospitalization (including imaging, microbiology, ancillary and laboratory) are listed below for reference.     Microbiology: Recent Results (from the past 240 hour(s))  Resp Panel by RT-PCR (Flu A&B, Covid) Nasopharyngeal Swab     Status: Abnormal   Collection Time: 12/28/20  9:10 PM   Specimen: Nasopharyngeal Swab; Nasopharyngeal(NP) swabs in vial transport medium  Result Value Ref Range Status   SARS Coronavirus 2 by RT PCR POSITIVE (A) NEGATIVE Final    Comment: RESULT CALLED TO, READ BACK BY AND VERIFIED WITH: Irena Reichmann RN, AT 2210 12/28/20 D. VANHOOK (NOTE) SARS-CoV-2 target nucleic acids are DETECTED.  The  SARS-CoV-2 RNA is generally detectable in upper respiratory specimens during the acute phase of infection. Positive results are indicative of the presence of the identified virus, but do not rule out bacterial infection  or co-infection with other pathogens not detected by the test. Clinical correlation with patient history and other diagnostic information is necessary to determine patient infection status. The expected result is Negative.  Fact Sheet for Patients: EntrepreneurPulse.com.au  Fact Sheet for Healthcare Providers: IncredibleEmployment.be  This test is not yet approved or cleared by the [place] FDA and  has been authorized for detection and/or diagnosis of SARS-CoV-2 by FDA under an Emergency Use Authorization (EUA).  This EUA will remain in effect (meaning this te st can be used) for the duration of  the COVID-19 declaration under Section 564(b)(1) of the Act, 21 U.S.C. section 360bbb-3(b)(1), unless the authorization is terminated or revoked sooner.     Influenza A by PCR NEGATIVE NEGATIVE Final   Influenza B by PCR NEGATIVE NEGATIVE Final    Comment: (NOTE) The Xpert Xpress SARS-CoV-2/FLU/RSV plus assay is intended as an aid in the diagnosis of influenza from Nasopharyngeal swab specimens and should not be used as a sole basis for treatment. Nasal washings and aspirates are unacceptable for Xpert Xpress SARS-CoV-2/FLU/RSV testing.  Fact Sheet for Patients: EntrepreneurPulse.com.au  Fact Sheet for Healthcare Providers: IncredibleEmployment.be  This test is not yet approved or cleared by the [place] FDA and has been authorized for detection and/or diagnosis of SARS-CoV-2 by FDA under an Emergency Use Authorization (EUA). This EUA will remain in effect (meaning this test can be used) for the duration of the COVID-19 declaration under Section 564(b)(1) of the Act, 21 U.S.C. section 360bbb-3(b)(1), unless the authorization is terminated or revoked.  Performed at [facility], [place] [address] [place], [place] [address]      Labs: Basic Metabolic Panel: Recent Labs  Lab 12/28/20 0535  12/28/20 1612 12/29/20 0535 12/30/20 0004 12/30/20 0755 12/31/20 0040  NA  --  138 137 136  --  141  K  --  4.1 4.5 3.8  --  4.1  CL  --  105 105 107  --  111  CO2  --  '25 25 23  '$ --  25  GLUCOSE  --  87 96 105* 101* 77  BUN  --  9 7* <5*  --  <5*  CREATININE  --  0.89 0.85 0.68  --  0.71  CALCIUM  --  9.5 9.0 8.7*  --  8.5*  MG  --   --  2.0  --   --   --   PHOS 2.9  --   --   --   --   --    Liver Function Tests: Recent Labs  Lab 12/28/20 1612 12/29/20 0535  AST 27 25  ALT 16 15  ALKPHOS 64 59  BILITOT 0.6 0.6  PROT 6.7 5.8*  ALBUMIN 3.3* 2.9*   CBC: Recent Labs  Lab 12/28/20 1612 12/29/20 1457 12/30/20 0004  WBC 5.6 5.1 4.1  NEUTROABS 4.2 3.7  --   HGB 15.1* 12.8 11.8*  HCT 47.1* 38.4 36.4  MCV 93.5 90.6 91.7  PLT 279 238 224   CBG: Recent Labs  Lab 12/30/20 0540 12/30/20 0904 12/30/20 1154 12/30/20 1602 12/31/20 0754  GLUCAP 42* 46* 91 51* 69*   Hgb A1c No results for input(s): HGBA1C in the last 72 hours. Lipid Profile No results for input(s): CHOL, HDL, LDLCALC, TRIG, CHOLHDL,  LDLDIRECT in the last 72 hours. Thyroid function studies Recent Labs    12/28/20 1801  TSH 45.405*   Urinalysis    Component Value Date/Time   COLORURINE YELLOW 12/28/2020 1944   APPEARANCEUR CLEAR 12/28/2020 1944   LABSPEC 1.006 12/28/2020 1944   PHURINE 7.0 12/28/2020 1944   GLUCOSEU NEGATIVE 12/28/2020 1944   HGBUR NEGATIVE 12/28/2020 1944   BILIRUBINUR NEGATIVE 12/28/2020 1944   KETONESUR NEGATIVE 12/28/2020 1944   PROTEINUR NEGATIVE 12/28/2020 1944   NITRITE NEGATIVE 12/28/2020 1944   LEUKOCYTESUR NEGATIVE 12/28/2020 1944    FURTHER DISCHARGE INSTRUCTIONS:   Get Medicines reviewed and adjusted: Please take all your medications with you for your next visit with your Primary MD   Laboratory/radiological data: Please request your Primary MD to go over all hospital tests and procedure/radiological results at the follow up, please ask your Primary MD to  get all Hospital records sent to his/her office.   In some cases, they will be blood work, cultures and biopsy results pending at the time of your discharge. Please request that your primary care M.D. goes through all the records of your hospital data and follows up on these results.   Also Note the following: If you experience worsening of your admission symptoms, develop shortness of breath, life threatening emergency, suicidal or homicidal thoughts you must seek medical attention immediately by calling 911 or calling your MD immediately  if symptoms less severe.   You must read complete instructions/literature along with all the possible adverse reactions/side effects for all the Medicines you take and that have been prescribed to you. Take any new Medicines after you have completely understood and accpet all the possible adverse reactions/side effects.    Do not drive when taking Pain medications or sleeping medications (Benzodaizepines)   Do not take more than prescribed Pain, Sleep and Anxiety Medications. It is not advisable to combine anxiety,sleep and pain medications without talking with your primary care practitioner   Special Instructions: If you have smoked or chewed Tobacco  in the last 2 yrs please stop smoking, stop any regular Alcohol  and or any Recreational drug use.   Wear Seat belts while driving.   Please note: You were cared for by a hospitalist during your hospital stay. Once you are discharged, your primary care physician will handle any further medical issues. Please note that NO REFILLS for any discharge medications will be authorized once you are discharged, as it is imperative that you return to your primary care physician (or establish a relationship with a primary care physician if you do not have one) for your post hospital discharge needs so that they can reassess your need for medications and monitor your lab values.  Time coordinating discharge: 40  minutes  SIGNED:  Marzetta Board, MD, PhD 12/31/2020, 10:19 AM

## 2020-12-31 NOTE — Care Management Important Message (Signed)
Important Message  Patient Details  Name: Beverly Edwards MRN: [id number] Date of Birth: 26-Feb-1946   Medicare Important Message Given:  Yes     Collen Hostler Montine Circle 12/31/2020, 3:00 PM

## 2020-12-31 NOTE — TOC Transition Note (Signed)
Transition of Care [facility]) - CM/SW Discharge Note   Patient Details  Name: Beverly Edwards MRN: [id number] Date of Birth: 1946/01/19  [facility] CM/SW Contact:  Carles Collet, RN Phone Number: 12/31/2020, 10:18 AM   Clinical Narrative:    Thomas Hoff MD, patient ready for DC if everything is set up with hospice. Spoke w daughter, Kennyth Lose, she confirms that oxygen will be delivered this morning before noon. Notified Bevely Palmer w [facility] that patient will DC today.  Confirmed DC address with Kennyth Lose. [facility] called for pick up around 2pm.     Final next level of care: Home w Hospice Care Barriers to Discharge: No Barriers Identified   Patient Goals and CMS Choice Patient states their goals for this hospitalization and ongoing recovery are:: to discharge with hospice CMS Medicare.gov Compare Post Acute Care list provided to:: Other (Comment Required) Choice offered to / list presented to : Spouse, Adult Children  Discharge Placement                       Discharge Plan and Services   Discharge Planning Services: CM Consult            DME Arranged: Oxygen DME Agency: Hospice and [place] Date DME Agency Contacted: 12/31/20   Representative spoke with at DME Agency: [place]: [facility] and [place] Date [place]: 12/31/20 Time [place]: [place] Representative spoke with at [place]: [place] ([place]) Interventions     Readmission Risk Interventions No flowsheet data found.

## 2020-12-31 NOTE — Progress Notes (Signed)
Pt left in stable condition per EMS. Msg left on daughter's machine that pt was leaving the hospital at this time.

## 2020-12-31 NOTE — Progress Notes (Signed)
Daughter and pt's husband left for the night. Daughter's phone number on board to call when pt leaves with EMS to come home.

## 2020-12-31 NOTE — Progress Notes (Signed)
  Speech Language Pathology Treatment: Dysphagia  Patient Details Name: Beverly Edwards MRN: [id number] DOB: 1946/03/19 Today's Date: 12/31/2020 Time: [id number] SLP Time Calculation (min) (ACUTE ONLY): 20 min  Assessment / Plan / Recommendation Clinical Impression  Pt seen for skilled treatment session with min-mod verbal/tactile cues given for compensatory strategies including initiating swallow, utilizing a straw effectively and consuming Dysphagia 1/thin liquids efficiently.  Pt with brief oral holding and audible swallow with tsp of thin liquids, but no coughing/throat clearing noted throughout session.  Straw sips attempted, but d/t pt's mentation, she was unable to utilize a straw effectively.  Discussed pt's swallowing safety/efficiency with her husband during session and need for FULL supervision with meals, slow rate, cueing to increase swallowing safety/timing, small bites/sips and limiting consistencies to increase hydration/nutrition to fullest at home.  Husband agreeable and appreciative for strategies given.  Pt is consuming current diet of Dysphagia 1/thin liquids without difficulty.  Continue current diet and ST will s/o at this time d/t pt meeting goal for diet tolerance/safety.     HPI HPI: 75yo female admitted 12/28/20 with weakness, unresponsiveness, possible seizure. PMH: advanced dementia, PVD, hypothyroidism, HTN. Pt is nonverbal at baseline; BSE completed on 12/30/20 indicating Dysphagia 1/thin liquids.      SLP Plan  Discharge SLP treatment due to (comment)       Recommendations  Diet recommendations: Dysphagia 1 (puree);Thin liquid Liquids provided via: Teaspoon;Straw Medication Administration: Crushed with puree Supervision: Full supervision/cueing for compensatory strategies;Trained caregiver to feed patient Compensations: Slow rate;Small sips/bites;Minimize environmental distractions Postural Changes and/or Swallow Maneuvers: Seated upright 90 degrees                 Oral Care Recommendations: Oral care QID Follow up Recommendations: 24 hour supervision/assistance SLP Visit Diagnosis: Dysphagia, unspecified (R13.10) Plan: Discharge SLP treatment due to (comment)                       Elvina Sidle, M.S., CCC-SLP 12/31/2020, 2:29 PM

## 2021-03-25 ENCOUNTER — Encounter: Payer: Self-pay | Admitting: Family

## 2021-03-25 ENCOUNTER — Ambulatory Visit (INDEPENDENT_AMBULATORY_CARE_PROVIDER_SITE_OTHER): Admitting: Family

## 2021-03-25 DIAGNOSIS — M79609 Pain in unspecified limb: Secondary | ICD-10-CM | POA: Diagnosis not present

## 2021-03-25 DIAGNOSIS — M79672 Pain in left foot: Secondary | ICD-10-CM | POA: Diagnosis not present

## 2021-03-25 DIAGNOSIS — M79671 Pain in right foot: Secondary | ICD-10-CM

## 2021-03-25 DIAGNOSIS — B351 Tinea unguium: Secondary | ICD-10-CM | POA: Diagnosis not present

## 2021-03-25 NOTE — Progress Notes (Signed)
Office Visit Note   Patient: Beverly Edwards           Date of Birth: 1945/05/19           MRN: [id number] Visit Date: 03/25/2021              Requested by: Harlan Stains, MD [place],  [place] [address] PCP: Harlan Stains, MD  Chief Complaint  Patient presents with   Left Foot - Follow-up    Nail trimming   Right Foot - Follow-up    Nail trimming      HPI: Is a 75 year old woman who presents today in routine follow-up for bilateral foot evaluation and nail trim.  She is accompanied by her husband.  Does have a history of Alzheimer's disease.  Assessment & Plan: Visit Diagnoses: No diagnosis found.  Plan: Onychomycotic nails trimmed x10 without incident.  Follow-up in the office in 3 months.  Follow-Up Instructions: No follow-ups on file.   Ortho Exam  Patient is alert, oriented, no adenopathy, well-dressed, normal affect, normal respiratory effort. On examination of bilateral feet there is no edema no erythema no sign of ulceration.  She does have thickened and discolored onychomycotic nails x10 she is unable to safely trim her own nails.  These were trimmed today without incident.  Imaging: No results found. No images are attached to the encounter.  Labs: No results found for: HGBA1C, ESRSEDRATE, CRP, LABURIC, REPTSTATUS, GRAMSTAIN, CULT, LABORGA   Lab Results  Component Value Date   ALBUMIN 2.9 (L) 12/29/2020   ALBUMIN 3.3 (L) 12/28/2020    Lab Results  Component Value Date   MG 2.0 12/29/2020   No results found for: VD25OH  No results found for: PREALBUMIN CBC EXTENDED Latest Ref Rng & Units 12/30/2020 12/29/2020 12/28/2020  WBC 4.0 - 10.5 K/uL 4.1 5.1 5.6  RBC 3.87 - 5.11 MIL/uL 3.97 4.24 5.04  HGB 12.0 - 15.0 g/dL 11.8(L) 12.8 15.1(H)  HCT 36.0 - 46.0 % 36.4 38.4 47.1(H)  PLT 150 - 400 K/uL 224 238 279  NEUTROABS 1.7 - 7.7 K/uL - 3.7 4.2  LYMPHSABS 0.7 - 4.0 K/uL - 1.1 1.2     There is no height or weight on file to  calculate BMI.  Orders:  No orders of the defined types were placed in this encounter.  No orders of the defined types were placed in this encounter.    Procedures: No procedures performed  Clinical Data: No additional findings.  ROS:  All other systems negative, except as noted in the HPI. Review of Systems  Objective: Vital Signs: There were no vitals taken for this visit.  Specialty Comments:  No specialty comments available.  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   Sinus bradycardia 12/28/2020   Hypothermia 12/28/2020   Lab test positive for detection of COVID-19 virus 12/28/2020   Goals of care, counseling/discussion 12/28/2020   Hypoglycemia 12/28/2020   Hypothyroidism 02/09/2018   Essential hypertension 02/09/2018   Foot injury, right, sequela 02/09/2018   Late onset Alzheimer's dementia without behavioral disturbance ([place]) 09/20/2016   Pain due to onychomycosis of nail 06/21/2016   Past Medical History:  Diagnosis Date   Cancer ([place]) 10/1998   r breast   Dementia ([facility])    Heart murmur    History of positive PPD    Hypertension    Hypothyroid  s/p radioactive iodine   Memory loss    Osteopenia     Family History  Problem Relation Age of Onset   Cancer Mother     Past Surgical History:  Procedure Laterality Date   BUNIONECTOMY     CARPAL TUNNEL RELEASE Right    FOOT FRACTURE SURGERY     MYOMECTOMY     Social History   Occupational History    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

## 2021-03-27 ENCOUNTER — Ambulatory Visit: Payer: Medicare HMO | Admitting: Family

## 2021-03-27 ENCOUNTER — Ambulatory Visit: Payer: Medicare HMO | Admitting: Physician Assistant

## 2021-04-01 ENCOUNTER — Ambulatory Visit: Payer: Medicare HMO | Admitting: Family

## 2021-05-26 DIAGNOSIS — G309 Alzheimer's disease, unspecified: Secondary | ICD-10-CM | POA: Diagnosis not present

## 2021-05-26 DIAGNOSIS — Z681 Body mass index (BMI) 19 or less, adult: Secondary | ICD-10-CM | POA: Diagnosis not present

## 2021-05-26 DIAGNOSIS — R636 Underweight: Secondary | ICD-10-CM | POA: Diagnosis not present

## 2021-05-26 DIAGNOSIS — E890 Postprocedural hypothyroidism: Secondary | ICD-10-CM | POA: Diagnosis not present

## 2021-05-26 DIAGNOSIS — G40909 Epilepsy, unspecified, not intractable, without status epilepticus: Secondary | ICD-10-CM | POA: Diagnosis not present

## 2021-05-26 DIAGNOSIS — R69 Illness, unspecified: Secondary | ICD-10-CM | POA: Diagnosis not present

## 2021-05-26 DIAGNOSIS — Z008 Encounter for other general examination: Secondary | ICD-10-CM | POA: Diagnosis not present

## 2021-05-26 DIAGNOSIS — I739 Peripheral vascular disease, unspecified: Secondary | ICD-10-CM | POA: Diagnosis not present

## 2021-05-26 DIAGNOSIS — Z7409 Other reduced mobility: Secondary | ICD-10-CM | POA: Diagnosis not present

## 2021-05-26 DIAGNOSIS — R32 Unspecified urinary incontinence: Secondary | ICD-10-CM | POA: Diagnosis not present

## 2021-05-26 DIAGNOSIS — I10 Essential (primary) hypertension: Secondary | ICD-10-CM | POA: Diagnosis not present

## 2021-06-25 ENCOUNTER — Encounter: Payer: Self-pay | Admitting: Orthopedic Surgery

## 2021-06-25 ENCOUNTER — Other Ambulatory Visit: Payer: Self-pay

## 2021-06-25 ENCOUNTER — Ambulatory Visit (INDEPENDENT_AMBULATORY_CARE_PROVIDER_SITE_OTHER): Payer: Medicare HMO | Admitting: Orthopedic Surgery

## 2021-06-25 DIAGNOSIS — G301 Alzheimer's disease with late onset: Secondary | ICD-10-CM | POA: Diagnosis not present

## 2021-06-25 DIAGNOSIS — F02818 Dementia in other diseases classified elsewhere, unspecified severity, with other behavioral disturbance: Secondary | ICD-10-CM

## 2021-06-25 DIAGNOSIS — B351 Tinea unguium: Secondary | ICD-10-CM

## 2021-06-25 DIAGNOSIS — M79609 Pain in unspecified limb: Secondary | ICD-10-CM

## 2021-06-25 DIAGNOSIS — R69 Illness, unspecified: Secondary | ICD-10-CM | POA: Diagnosis not present

## 2021-06-25 NOTE — Progress Notes (Signed)
Office Visit Note   Patient: Beverly Edwards           Date of Birth: [birth year]           MRN: [id number] Visit Date: 06/25/2021              Requested by: Harlan Stains, [address],  [place] [id number] PCP: Harlan Stains, MD  Chief Complaint  Patient presents with   Right Foot - Follow-up    Nail trim   Left Foot - Follow-up      HPI: Patient is a 76 year old woman with advanced dementia with thickened discolored onychomycotic nails.  Patient is seen in follow-up for evaluation for both feet.  Assessment & Plan: Visit Diagnoses:  1. Onychomycosis   2. Pain due to onychomycosis of nail   3. Late onset Alzheimer's disease with behavioral disturbance ([place])     Plan: Nails were trimmed C14 without complications.  Follow-Up Instructions: Return in about 3 months (around 09/22/2021).   Ortho Exam  Patient is alert, oriented, no adenopathy, well-dressed, normal affect, normal respiratory effort. Examination patient does have increased swelling in both lower extremities but no ulcers.  There is no evidence of a paronychial infection no cellulitis in her foot.  She does have thickened discolored onychomycotic nails x10 and she is unable to safely trim the room on her own and family is unable to safely trim them due to her advanced dementia.  Nails were trimmed G81 without complications.  Imaging: No results found. No images are attached to the encounter.  Labs: No results found for: HGBA1C, ESRSEDRATE, CRP, LABURIC, REPTSTATUS, GRAMSTAIN, CULT, LABORGA   Lab Results  Component Value Date   ALBUMIN 2.9 (L) 12/29/2020   ALBUMIN 3.3 (L) 12/28/2020    Lab Results  Component Value Date   MG 2.0 12/29/2020   No results found for: VD25OH  No results found for: PREALBUMIN CBC EXTENDED Latest Ref Rng & Units 12/30/2020 12/29/2020 12/28/2020  WBC 4.0 - 10.5 K/uL 4.1 5.1 5.6  RBC 3.87 - 5.11 MIL/uL 3.97 4.24 5.04  HGB 12.0 - 15.0 g/dL 11.8(L) 12.8  15.1(H)  HCT 36.0 - 46.0 % 36.4 38.4 47.1(H)  PLT 150 - 400 K/uL 224 238 279  NEUTROABS 1.7 - 7.7 K/uL - 3.7 4.2  LYMPHSABS 0.7 - 4.0 K/uL - 1.1 1.2     There is no height or weight on file to calculate BMI.  Orders:  No orders of the defined types were placed in this encounter.  No orders of the defined types were placed in this encounter.    Procedures: No procedures performed  Clinical Data: No additional findings.  ROS:  All other systems negative, except as noted in the HPI. Review of Systems  Objective: Vital Signs: There were no vitals taken for this visit.  Specialty Comments:  No specialty comments available.  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[place]) 12/28/2020   Sinus bradycardia 12/28/2020   Hypothermia 12/28/2020   Lab test positive for detection of COVID-19 virus 12/28/2020   Goals of care, counseling/discussion 12/28/2020   Hypoglycemia 12/28/2020   Hypothyroidism 02/09/2018   Essential hypertension 02/09/2018   Foot injury, right, sequela 02/09/2018   Late onset Alzheimer's dementia without behavioral disturbance ([place]) 09/20/2016   Pain due to onychomycosis of nail 06/21/2016   Past Medical History:  Diagnosis Date  Cancer ([place]) 10/1998   r breast   Dementia ([facility])    Heart murmur    History of positive PPD    Hypertension    Hypothyroid    s/p radioactive iodine   Memory loss    Osteopenia     Family History  Problem Relation Age of Onset   Cancer Mother     Past Surgical History:  Procedure Laterality Date   BUNIONECTOMY     CARPAL TUNNEL RELEASE Right    FOOT FRACTURE SURGERY     MYOMECTOMY     Social History   Occupational History    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

## 2021-09-24 ENCOUNTER — Ambulatory Visit: Payer: Medicare HMO | Admitting: Orthopedic Surgery

## 2021-09-24 DIAGNOSIS — B351 Tinea unguium: Secondary | ICD-10-CM

## 2021-09-24 DIAGNOSIS — M79609 Pain in unspecified limb: Secondary | ICD-10-CM | POA: Diagnosis not present

## 2021-09-26 ENCOUNTER — Encounter: Payer: Self-pay | Admitting: Orthopedic Surgery

## 2021-09-26 NOTE — Progress Notes (Signed)
? ?Office Visit Note ?  ?Patient: Beverly Edwards           ?Date of Birth: 07/08/1945           ?MRN: [id number] ?Visit Date: 09/24/2021 ?             ?Requested by: Harlan Stains, MD ?[address] ?[address] ?[place],  [place] [address] ?PCP: Harlan Stains, MD ? ?Chief Complaint  ?Patient presents with  ? Right Foot - Follow-up  ? Left Foot - Follow-up  ? ? ? ? ?HPI: ?Patient is a 76 year old woman with dementia as she is unable to safely trim the nails on her own and her husband is unable to trim the nails secondary to mental status changes. ? ?Assessment & Plan: ?Visit Diagnoses:  ?1. Onychomycosis   ?2. Pain due to onychomycosis of nail   ? ? ?Plan: Nails were trimmed I71 without complications. ? ?Follow-Up Instructions: Return in about 3 months (around 12/25/2021).  ? ?Ortho Exam ? ?Patient is alert, oriented, no adenopathy, well-dressed, normal affect, normal respiratory effort. ?Examination patient shows progressive deterioration of her mental status.  She has thickened discolored onychomycotic nails that she can not trim safely on her own.  The nails were trimmed I45 without complications. ? ?Imaging: ?No results found. ?No images are attached to the encounter. ? ?Labs: ?No results found for: HGBA1C, ESRSEDRATE, CRP, LABURIC, REPTSTATUS, GRAMSTAIN, CULT, LABORGA ? ? ?Lab Results  ?Component Value Date  ? ALBUMIN 2.9 (L) 12/29/2020  ? ALBUMIN 3.3 (L) 12/28/2020  ? ? ?Lab Results  ?Component Value Date  ? MG 2.0 12/29/2020  ? ?No results found for: VD25OH ? ?No results found for: PREALBUMIN ? ?  Latest Ref Rng & Units 12/30/2020  ? 12:04 AM 12/29/2020  ?  2:57 PM 12/28/2020  ?  4:12 PM  ?CBC EXTENDED  ?WBC 4.0 - 10.5 K/uL 4.1   5.1   5.6    ?RBC 3.87 - 5.11 MIL/uL 3.97   4.24   5.04    ?Hemoglobin 12.0 - 15.0 g/dL 11.8   12.8   15.1    ?HCT 36.0 - 46.0 % 36.4   38.4   47.1    ?Platelets 150 - 400 K/uL 224   238   279    ?NEUT# 1.7 - 7.7 K/uL  3.7   4.2    ?Lymph# 0.7 - 4.0 K/uL  1.1   1.2    ? ? ? ?There is no  height or weight on file to calculate BMI. ? ?Orders:  ?No orders of the defined types were placed in this encounter. ? ?No orders of the defined types were placed in this encounter. ? ? ? Procedures: ?No procedures performed ? ?Clinical Data: ?No additional findings. ? ?ROS: ? ?All other systems negative, except as noted in the HPI. ?Review of Systems ? ?Objective: ?Vital Signs: There were no vitals taken for this visit. ? ?Specialty Comments:  ?No specialty comments available. ? ?PMFS History: ?Patient Active Problem List  ? Diagnosis Date Noted  ? Dementia with behavioral disturbance ([place])   ? 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 Sinus bradycardia 12/28/2020  ? Hypothermia 12/28/2020  ? Lab test positive for detection of COVID-19 virus 12/28/2020  ? Goals of care, counseling/discussion 12/28/2020  ? Hypoglycemia 12/28/2020  ? Hypothyroidism 02/09/2018  ? Essential hypertension 02/09/2018  ? Foot injury, right, sequela 02/09/2018  ? Late onset Alzheimer's dementia without behavioral disturbance ([place]) 09/20/2016  ? Pain due  to onychomycosis of nail 06/21/2016  ? ?Past Medical History:  ?Diagnosis Date  ? Cancer [facility]) 10/1998  ? r breast  ? Dementia ([place])   ? Heart murmur   ? History of positive PPD   ? Hypertension   ? Hypothyroid   ? s/p radioactive iodine  ? Memory loss   ? Osteopenia   ?  ?Family History  ?Problem Relation Age of Onset  ? Cancer Mother   ?  ?Past Surgical History:  ?Procedure Laterality Date  ? BUNIONECTOMY    ? CARPAL TUNNEL RELEASE Right   ? FOOT FRACTURE SURGERY    ? MYOMECTOMY    ? ?Social History  ? ?Occupational History  ?  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 Smoking status: Never  ? Smokeless tobacco: Never  ?Vaping Use  ? Vaping Use: Never used  ?Substance and Sexual Activity  ? Alcohol use: No  ?  Alcohol/week: 0.0 standard drinks  ?  Comment: No Longer since 2004  ? Drug use: No  ? Sexual activity: Not on file  ? ? ? ? ? ?

## 2021-12-20 ENCOUNTER — Emergency Department (HOSPITAL_COMMUNITY)

## 2021-12-20 ENCOUNTER — Encounter (HOSPITAL_COMMUNITY): Payer: Self-pay

## 2021-12-20 ENCOUNTER — Emergency Department (HOSPITAL_COMMUNITY)
Admission: EM | Admit: 2021-12-20 | Discharge: 2021-12-20 | Disposition: A | Discharge status: Home / Self Care | Attending: Emergency Medicine | Admitting: Emergency Medicine

## 2021-12-20 ENCOUNTER — Other Ambulatory Visit: Payer: Self-pay

## 2021-12-20 DIAGNOSIS — R001 Bradycardia, unspecified: Secondary | ICD-10-CM | POA: Diagnosis not present

## 2021-12-20 DIAGNOSIS — R4020 Unspecified coma: Secondary | ICD-10-CM | POA: Diagnosis not present

## 2021-12-20 DIAGNOSIS — F0280 Dementia in other diseases classified elsewhere without behavioral disturbance: Secondary | ICD-10-CM | POA: Insufficient documentation

## 2021-12-20 DIAGNOSIS — Z515 Encounter for palliative care: Secondary | ICD-10-CM | POA: Diagnosis not present

## 2021-12-20 DIAGNOSIS — R4182 Altered mental status, unspecified: Secondary | ICD-10-CM | POA: Diagnosis not present

## 2021-12-20 DIAGNOSIS — R404 Transient alteration of awareness: Secondary | ICD-10-CM | POA: Diagnosis not present

## 2021-12-20 DIAGNOSIS — R569 Unspecified convulsions: Secondary | ICD-10-CM | POA: Diagnosis not present

## 2021-12-20 DIAGNOSIS — R69 Illness, unspecified: Secondary | ICD-10-CM | POA: Diagnosis not present

## 2021-12-20 DIAGNOSIS — G300 Alzheimer's disease with early onset: Secondary | ICD-10-CM | POA: Diagnosis not present

## 2021-12-20 DIAGNOSIS — Z743 Need for continuous supervision: Secondary | ICD-10-CM | POA: Diagnosis not present

## 2021-12-20 DIAGNOSIS — Z20822 Contact with and (suspected) exposure to covid-19: Secondary | ICD-10-CM | POA: Diagnosis not present

## 2021-12-20 LAB — BASIC METABOLIC PANEL
Anion gap: 8 (ref 5–15)
BUN: 8 mg/dL (ref 8–23)
CO2: 24 mmol/L (ref 22–32)
Calcium: 8.9 mg/dL (ref 8.9–10.3)
Chloride: 105 mmol/L (ref 98–111)
Creatinine, Ser: 0.71 mg/dL (ref 0.44–1.00)
GFR, Estimated: >60 mL/min (ref >60)
Glucose, Bld: 76 mg/dL (ref 70–99)
Potassium: 4.2 mmol/L (ref 3.5–5.1)
Sodium: 137 mmol/L (ref 135–145)

## 2021-12-20 LAB — CBC WITH DIFFERENTIAL/PLATELET
Abs Immature Granulocytes: 0.25 10*3/uL — ABNORMAL HIGH (ref 0.00–0.07)
Basophils Absolute: 0 10*3/uL (ref 0.0–0.1)
Basophils Relative: 1 %
Eosinophils Absolute: 0 10*3/uL (ref 0.0–0.5)
Eosinophils Relative: 1 %
HCT: 42.1 % (ref 36.0–46.0)
Hemoglobin: 13.8 g/dL (ref 12.0–15.0)
Immature Granulocytes: 6 %
Lymphocytes Relative: 28 %
Lymphs Abs: 1.1 10*3/uL (ref 0.7–4.0)
MCH: 31.7 pg (ref 26.0–34.0)
MCHC: 32.8 g/dL (ref 30.0–36.0)
MCV: 96.8 fL (ref 80.0–100.0)
Monocytes Absolute: 0.1 10*3/uL (ref 0.1–1.0)
Monocytes Relative: 3 %
Neutro Abs: 2.4 10*3/uL (ref 1.7–7.7)
Neutrophils Relative %: 61 %
Platelets: 114 10*3/uL — ABNORMAL LOW (ref 150–400)
RBC: 4.35 MIL/uL (ref 3.87–5.11)
RDW: 13.8 % (ref 11.5–15.5)
WBC: 3.9 10*3/uL — ABNORMAL LOW (ref 4.0–10.5)
nRBC: 0 % (ref 0.0–0.2)

## 2021-12-20 LAB — SARS CORONAVIRUS 2 BY RT PCR: SARS Coronavirus 2 by RT PCR: NEGATIVE

## 2021-12-20 LAB — TROPONIN I (HIGH SENSITIVITY): Troponin I (High Sensitivity): 8 ng/L (ref <18)

## 2021-12-20 MED ORDER — LEVETIRACETAM IN NACL 1000 MG/100ML IV SOLN
1000.0000 mg | Freq: Once | INTRAVENOUS | Status: AC
  Administered 2021-12-20: 1000 mg via INTRAVENOUS
  Filled 2021-12-20: qty 100

## 2021-12-20 MED ORDER — ACETAMINOPHEN 500 MG PO TABS: 1000.0000 mg | ORAL_TABLET | Freq: Once | ORAL | Status: DC

## 2021-12-20 NOTE — ED Provider Notes (Signed)
[facility] EMERGENCY DEPARTMENT Provider Note   CSN: [id number] Arrival date & time: 12/20/21  3428     History  Chief Complaint  Patient presents with   Bradycardia   Altered Mental Status    DIMPLES PROBUS is a 76 y.o. female.   Altered Mental Status    76 year old female with past medical history of Alzheimer's dementia (dx approx 2010), peripheral vascular disease, hypertension, hypothyroidism who presents to the emergency department with bradycardia, altered mental status, found in a chair unresponsive this morning.  EMS reports that the patient was back to her baseline after around 20 to 30 minutes.  Her baseline is nonverbal.  She has a history of dementia and seizures.  CBG with EMS was 84.  She was vitally stable, blood pressure 1 30-1 70, heart rate in the 40s with EMS.  SPO2 100% on room air.  History limited by the patient's dementia no family members are currently bedside.  The patient is DNR and arrives with paperwork.  She appears to have been on hospice since August 2022.  Additional family members arrived to bedside to include the patient's daughter and the patient's husband.  They state that she had an episode of unresponsiveness, gurgling at the mouth, not responding to stimuli.  It resolved shortly after EMS arrival.  The patient was noted to have bitten her tongue.  She does have a history of seizures but had not had any seizures over the last year and has been on her Keppra.  Her baseline functional status is nonverbal, unable to take care of her own ADLs.  Family were states that she is back to her baseline mental status currently.  Home Medications Prior to Admission medications   Medication Sig Start Date End Date Taking? Authorizing Provider  donepezil (ARICEPT) 10 MG tablet Take 1 tablet (10 mg total) by mouth at bedtime. Patient not taking: Reported on 12/28/2020 05/03/16   Penumalli, Earlean Polka, MD  levETIRAcetam (KEPPRA) 250 MG tablet Take  1 tablet (250 mg total) by mouth 2 (two) times daily. 12/31/20   Caren Griffins, MD  levothyroxine (SYNTHROID, LEVOTHROID) 75 MCG tablet Take 75 mcg by mouth daily. 11/20/17   [provider]  lisinopril (ZESTRIL) 5 MG tablet Take 5 mg by mouth daily. 07/31/19   [provider]      Allergies    Patient has no known allergies.    Review of Systems   Review of Systems  Unable to perform ROS: Dementia    Physical Exam Updated Vital Signs BP 120/86   Pulse (!) 47   Resp 12   SpO2 100%  Physical Exam Vitals and nursing note reviewed.  Constitutional:      General: She is not in acute distress. HENT:     Head: Normocephalic.     Comments: Bleeding from the mouth concerning for tongue laceration Eyes:     Conjunctiva/sclera: Conjunctivae normal.     Pupils: Pupils are equal, round, and reactive to light.  Cardiovascular:     Rate and Rhythm: Regular rhythm. Bradycardia present.     Pulses: Normal pulses.  Pulmonary:     Effort: Pulmonary effort is normal. No respiratory distress.  Abdominal:     General: There is no distension.     Tenderness: There is no guarding.  Musculoskeletal:        General: No deformity or signs of injury.     Cervical back: Neck supple.  Skin:  Findings: No lesion or rash.  Neurological:     General: No focal deficit present.     Mental Status: She is alert. Mental status is at baseline.     Comments: Disoriented to person, place, year, nonverbal, no pupillary defect, no clear cranial nerve deficit, moving all 4 extremities spontaneously and in response to painful stimuli     ED Results / Procedures / Treatments   Labs (all labs ordered are listed, but only abnormal results are displayed) Labs Reviewed  CBC WITH DIFFERENTIAL/PLATELET - Abnormal; Notable for the following components:      Result Value   WBC 3.9 (*)    Platelets 114 (*)    Abs Immature Granulocytes 0.25 (*)    All other components within normal limits   SARS CORONAVIRUS 2 BY RT PCR  BASIC METABOLIC PANEL  LEVETIRACETAM LEVEL  TROPONIN I (HIGH SENSITIVITY)    EKG EKG Interpretation  Date/Time:  Sunday December 20 2021 12:25:28 EDT Ventricular Rate:  117 PR Interval:  129 QRS Duration: 94 QT Interval:  457 QTC Calculation: 413 R Axis:   92 Text Interpretation: Sinus bradycardia with artifact Ventricular tachycardia, unsustained Right axis deviation Consider left ventricular hypertrophy Confirmed by Regan Lemming ([id number]) on 12/20/2021 1:19:48 PM  Radiology CT HEAD WO CONTRAST (5MM)  Result Date: 12/20/2021 CLINICAL DATA:  Mental status change. EXAM: CT HEAD WITHOUT CONTRAST TECHNIQUE: Contiguous axial images were obtained from the base of the skull through the vertex without intravenous contrast. RADIATION DOSE REDUCTION: This exam was performed according to the departmental dose-optimization program which includes automated exposure control, adjustment of the mA and/or kV according to patient size and/or use of iterative reconstruction technique. COMPARISON:  12/28/2020 FINDINGS: Brain: No evidence of acute infarction, hemorrhage, hydrocephalus, extra-axial collection or mass lesion/mass effect. There is ventricular sulcal enlargement reflecting advanced atrophy. Patchy bilateral white matter hypoattenuation is also noted consistent with moderate chronic microvascular ischemic change. These findings are stable from the prior head CT. Vascular: No hyperdense vessel or unexpected calcification. Skull: Normal. Negative for fracture or focal lesion. Sinuses/Orbits: Globes and orbits are unremarkable. Visualized sinuses are clear. Other: None. IMPRESSION: 1. No acute intracranial abnormalities. 2. Atrophy and chronic microvascular ischemic change stable from the prior head CT. Electronically Signed   By: Lajean Manes M.D.   On: 12/20/2021 10:48   DG Chest Portable 1 View  Result Date: 12/20/2021 CLINICAL DATA:  Bradycardia.  Altered mental status.  EXAM: PORTABLE CHEST 1 VIEW COMPARISON:  12/28/2020. FINDINGS: Cardiac silhouette normal in size.  No mediastinal or hilar masses. Lungs are hyperexpanded, but clear. No convincing pleural effusion or pneumothorax. Skeletal structures are demineralized, grossly intact. IMPRESSION: No acute cardiopulmonary disease. Electronically Signed   By: Lajean Manes M.D.   On: 12/20/2021 10:25    Procedures Procedures    Medications Ordered in ED Medications  acetaminophen (TYLENOL) tablet 1,000 mg (1,000 mg Oral Not Given 12/20/21 1045)  levETIRAcetam (KEPPRA) IVPB 1000 mg/100 mL premix (1,000 mg Intravenous New Bag/Given 12/20/21 1308)    ED Course/ Medical Decision Making/ A&P                           Medical Decision Making Amount and/or Complexity of Data Reviewed Labs: ordered. Radiology: ordered.  Risk OTC drugs. Prescription drug management.    76 year old female with past medical history of Alzheimer's dementia (dx approx 2010), peripheral vascular disease, hypertension, hypothyroidism who presents to the emergency department with bradycardia, altered  mental status, found in a chair unresponsive this morning.  EMS reports that the patient was back to her baseline after around 20 to 30 minutes.  Her baseline is nonverbal.  She has a history of dementia and seizures.  CBG with EMS was 84.  She was vitally stable, blood pressure 1 30-1 70, heart rate in the 40s with EMS.  SPO2 100% on room air.  History limited by the patient's dementia no family members are currently bedside.  The patient is DNR and arrives with paperwork.  She appears to have been on hospice since August 2022.  Additional family members arrived to bedside to include the patient's daughter and the patient's husband.  They state that she had an episode of unresponsiveness, gurgling at the mouth, not responding to stimuli.  It resolved shortly after EMS arrival.  The patient was noted to have bitten her tongue.  She does have a  history of seizures but had not had any seizures over the last year and has been on her Keppra.  Her baseline functional status is nonverbal, unable to take care of her own ADLs.  Family were states that she is back to her baseline mental status currently.  On arrival, the patient was bradycardic on telemetry with heart rates in the 40s.  Family over states that the patient is always bradycardic. IV access was obtained.   The patient had sinus bradycardia on telemetry which was confirmed by EKG with  no ischemic changes noted.  Initial troponin was 8, BMP unremarkable with a CBG of 76, CBC with leukopenia, mild thrombocytopenia to 3.9 and 114 respectively, no anemia, COVID-19 PCR testing negative.  On repeat evaluation, the patient's mental status had returned to her baseline.  She was alert and tolerating oral intake.  CT of the head was performed which revealed no acute intracranial abnormality and chest x-ray was unremarkable for evidence of aspiration or other airspace abnormality.  The patient was loaded with 1 g of IV Keppra.  Suspect likely breakthrough seizure.  Recommended the patient resume her home Keppra, follow-up outpatient to discuss further medication management of her AED.  Return precautions provided.   Final Clinical Impression(s) / ED Diagnoses Final diagnoses:  Bradycardia  Seizure [facility])  Hospice care patient    Rx / DC Orders ED Discharge Orders     None         Regan Lemming, MD 12/20/21 1325

## 2021-12-20 NOTE — Discharge Instructions (Signed)
Recommend resuming her home Keppra tomorrow morning. She got a '1000mg'$  loading dose in the ED today

## 2021-12-20 NOTE — ED Triage Notes (Signed)
Pt coming from home via [facility] for altered mental status and bradycardia. Pt is a hospice pt. Husband got pt up into the chair this morning and when he went to check on her he found her unresponsive. EMS reports pt was back to her baseline after 20-30 mins. Pt at baseline is nonverbal, hx of dementia, hx of seizures.   BP 130-70 HR 40s SPO2 100% room air CBG 84

## 2021-12-28 ENCOUNTER — Ambulatory Visit: Payer: Medicare Other | Admitting: Orthopedic Surgery

## 2021-12-30 ENCOUNTER — Ambulatory Visit: Payer: Medicare HMO | Admitting: Family

## 2021-12-31 ENCOUNTER — Ambulatory Visit: Payer: Medicare Other | Admitting: Orthopedic Surgery

## 2022-01-06 ENCOUNTER — Ambulatory Visit: Payer: Medicare HMO | Admitting: Family

## 2022-01-26 ENCOUNTER — Encounter: Payer: Self-pay | Admitting: Orthopedic Surgery

## 2022-01-26 ENCOUNTER — Ambulatory Visit (INDEPENDENT_AMBULATORY_CARE_PROVIDER_SITE_OTHER): Payer: Medicare HMO | Admitting: Orthopedic Surgery

## 2022-01-26 DIAGNOSIS — L98491 Non-pressure chronic ulcer of skin of other sites limited to breakdown of skin: Secondary | ICD-10-CM | POA: Diagnosis not present

## 2022-01-26 DIAGNOSIS — G301 Alzheimer's disease with late onset: Secondary | ICD-10-CM | POA: Diagnosis not present

## 2022-01-26 DIAGNOSIS — F02818 Dementia in other diseases classified elsewhere, unspecified severity, with other behavioral disturbance: Secondary | ICD-10-CM

## 2022-01-26 DIAGNOSIS — B351 Tinea unguium: Secondary | ICD-10-CM

## 2022-01-26 DIAGNOSIS — M25571 Pain in right ankle and joints of right foot: Secondary | ICD-10-CM

## 2022-01-26 DIAGNOSIS — M25572 Pain in left ankle and joints of left foot: Secondary | ICD-10-CM

## 2022-01-26 DIAGNOSIS — R69 Illness, unspecified: Secondary | ICD-10-CM | POA: Diagnosis not present

## 2022-01-26 NOTE — Progress Notes (Signed)
Office Visit Note   Patient: Beverly Edwards           Date of Birth: 1945-11-18           MRN: [id number] Visit Date: 01/26/2022              Requested by: Harlan Stains, MD [address] [place],  [place] [address] PCP: Harlan Stains, MD  Chief Complaint  Patient presents with   Right Foot - Follow-up    Bilateral toenail trimming   Left Foot - Follow-up      HPI: Patient is a 76 year old woman who is seen for evaluation both lower extremities.  Patient has had a history of calluses ulcers and painful onychomycotic nails.  Patient and family are unable to safely trim the nails due to her Alzheimer's dementia  Assessment & Plan: Visit Diagnoses:  1. Onychomycosis   2. Late onset Alzheimer's disease with behavioral disturbance ([place])   3. Callous ulcer, limited to breakdown of skin ([place])     Plan: Nails were trimmed x10 calluses pared x2  Follow-Up Instructions: No follow-ups on file.   Ortho Exam  Patient is alert, oriented, no adenopathy, well-dressed, normal affect, normal respiratory effort. Examination patient's skin is healthy and intact there is no  Imaging: Open ulcers.  Patient has large areas of callus over the heel this was removed without complications these were each 6 cm in diameter.  Patient has thickened discolored onychomycotic nails x10 she is unable to safely trim them on her own and nails were trimmed P23 without complication. No results found. No images are attached to the encounter.  Labs: No results found for: "HGBA1C", "ESRSEDRATE", "CRP", "LABURIC", "REPTSTATUS", "GRAMSTAIN", "CULT", "LABORGA"   Lab Results  Component Value Date   ALBUMIN 2.9 (L) 12/29/2020   ALBUMIN 3.3 (L) 12/28/2020    Lab Results  Component Value Date   MG 2.0 12/29/2020   No results found for: "VD25OH"  No results found for: "PREALBUMIN"    Latest Ref Rng & Units 12/20/2021    9:48 AM 12/30/2020   12:04 AM 12/29/2020    2:57 PM  CBC EXTENDED   WBC 4.0 - 10.5 K/uL 3.9  4.1  5.1   RBC 3.87 - 5.11 MIL/uL 4.35  3.97  4.24   Hemoglobin 12.0 - 15.0 g/dL 13.8  11.8  12.8   HCT 36.0 - 46.0 % 42.1  36.4  38.4   Platelets 150 - 400 K/uL 114  224  238   NEUT# 1.7 - 7.7 K/uL 2.4   3.7   Lymph# 0.7 - 4.0 K/uL 1.1   1.1      There is no height or weight on file to calculate BMI.  Orders:  No orders of the defined types were placed in this encounter.  No orders of the defined types were placed in this encounter.    Procedures: No procedures performed  Clinical Data: No additional findings.  ROS:  All other systems negative, except as noted in the HPI. Review of Systems  Objective: Vital Signs: There were no vitals taken for this visit.  Specialty Comments:  No specialty comments available.  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[place]) 12/28/2020   Sinus bradycardia 12/28/2020   Hypothermia 12/28/2020   Lab test positive for detection of COVID-19 virus 12/28/2020   Goals of care, counseling/discussion 12/28/2020  Hypoglycemia 12/28/2020   Hypothyroidism 02/09/2018   Essential hypertension 02/09/2018   Foot injury, right, sequela 02/09/2018   Late onset Alzheimer's dementia without behavioral disturbance ([place]) 09/20/2016   Pain due to onychomycosis of nail 06/21/2016   Past Medical History:  Diagnosis Date   Cancer ([place]) 10/1998   r breast   Dementia ([facility])    Heart murmur    History of positive PPD    Hypertension    Hypothyroid    s/p radioactive iodine   Memory loss    Osteopenia     Family History  Problem Relation Age of Onset   Cancer Mother     Past Surgical History:  Procedure Laterality Date   BUNIONECTOMY     CARPAL TUNNEL RELEASE Right    FOOT FRACTURE SURGERY     MYOMECTOMY     Social History   Occupational History    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

## 2022-04-16 DEATH — deceased

## 2022-04-27 ENCOUNTER — Ambulatory Visit: Payer: Medicare HMO | Admitting: Orthopedic Surgery

## 2022-06-05 IMAGING — CT CT HEAD W/O CM
4 of 5 series · 15 of 47 positions shown, 17 images · non-contrast
Comparison: 11/16/2020, 10/08/2019

CLINICAL DATA: Delirium, syncope

EXAM:
CT HEAD WITHOUT CONTRAST
TECHNIQUE: Contiguous axial images were obtained from the base of the skull
through the vertex without intravenous contrast.

[Series 3: head without · axial · non-contrast · 0.46mm/px · z∈[-120,-30]mm · 4 of 32 slices shown]
[im 7/32  brain]
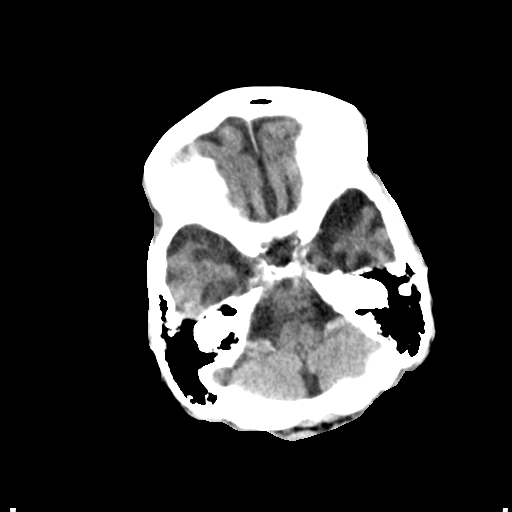
[im 13/32  brain]
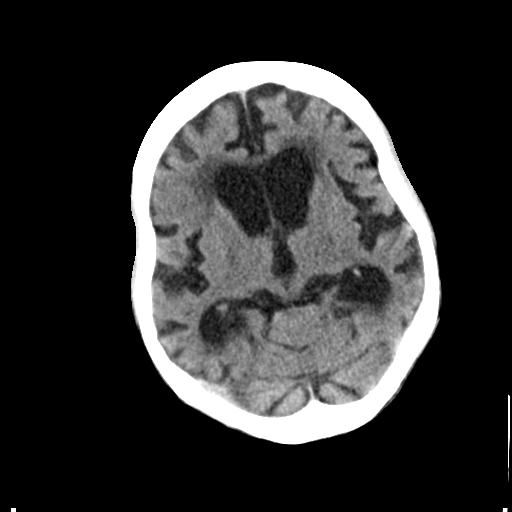
[im 19/32  brain]
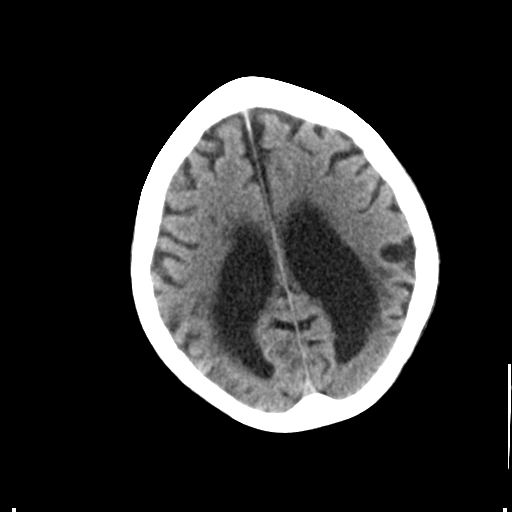
[im 25/32  brain]
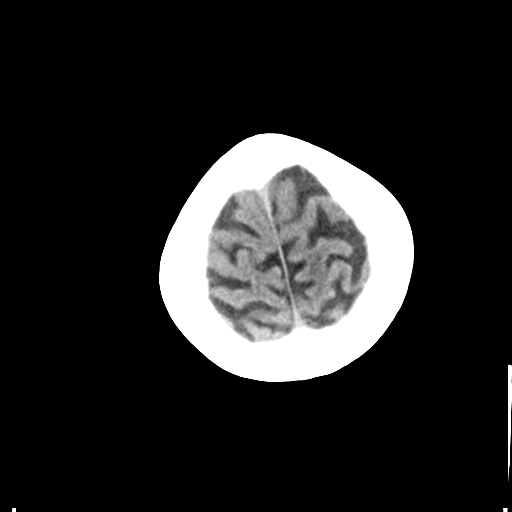

[Series 4: head without ax · axial · non-contrast · 0.39mm/px · z∈[-128,-32]mm · 5 of 32 slices shown, 7 images]
[im 6/32  brain]
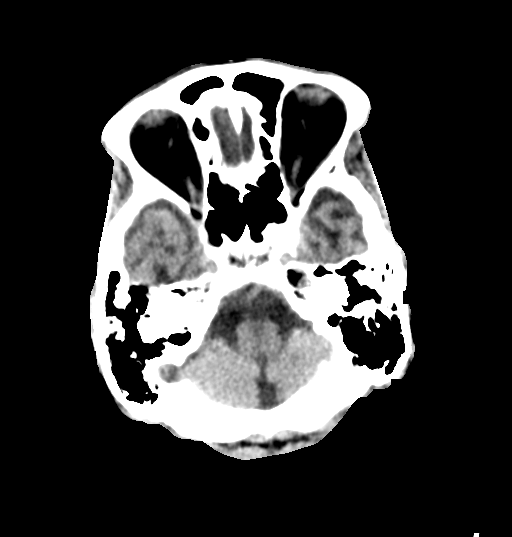
[im 6/32  bone]
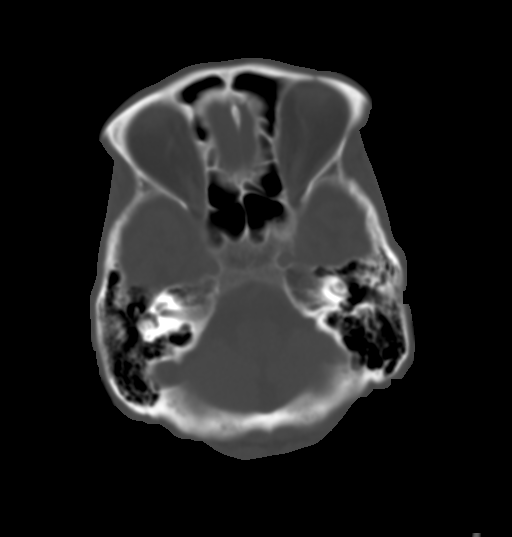
[im 11/32  brain]
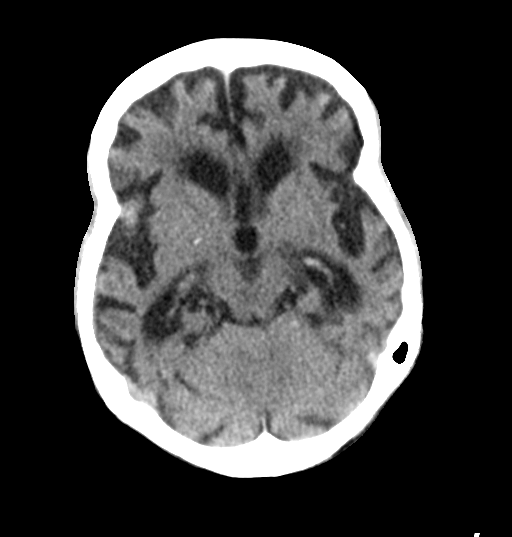
[im 16/32  brain]
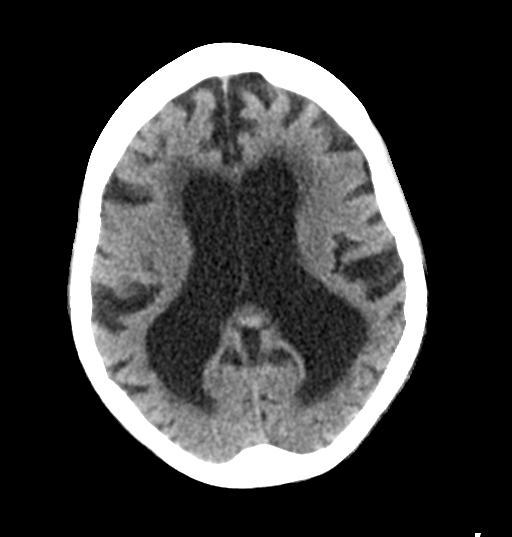
[im 21/32  brain]
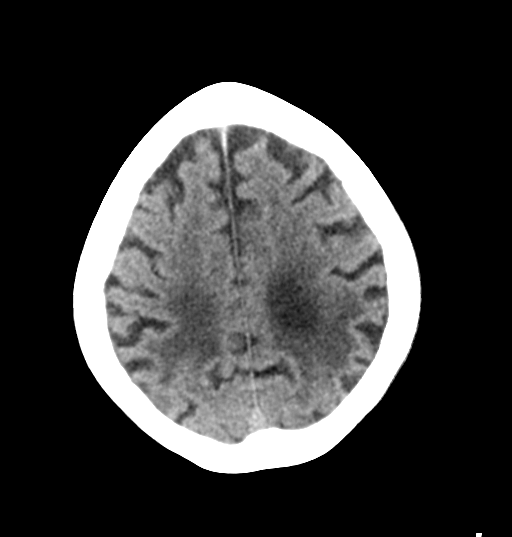
[im 26/32  brain]
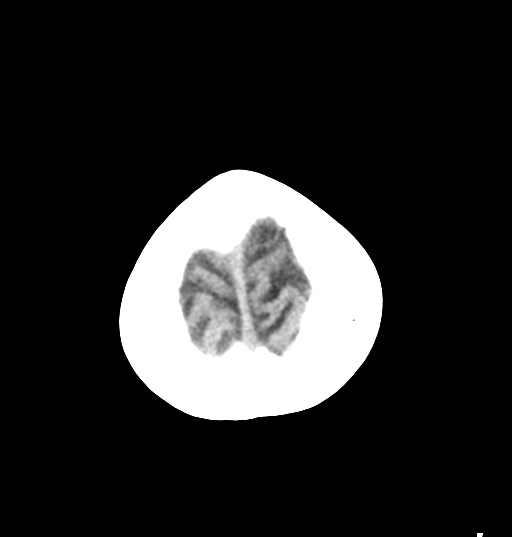
[im 26/32  bone]
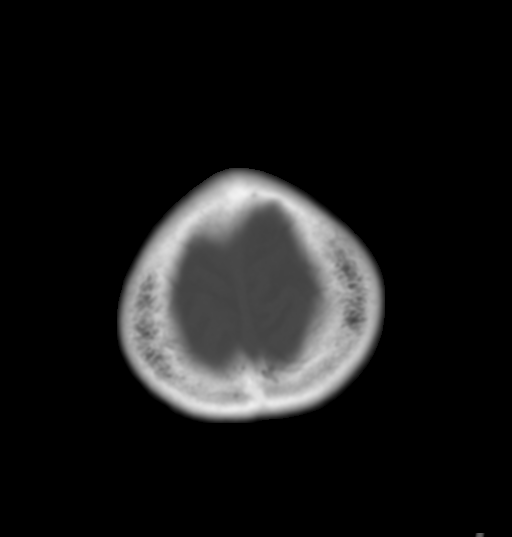

[Series 6: head without cor · coronal · non-contrast · 0.30mm/px · 3 of 63 slices shown]
[im 21/63  brain]
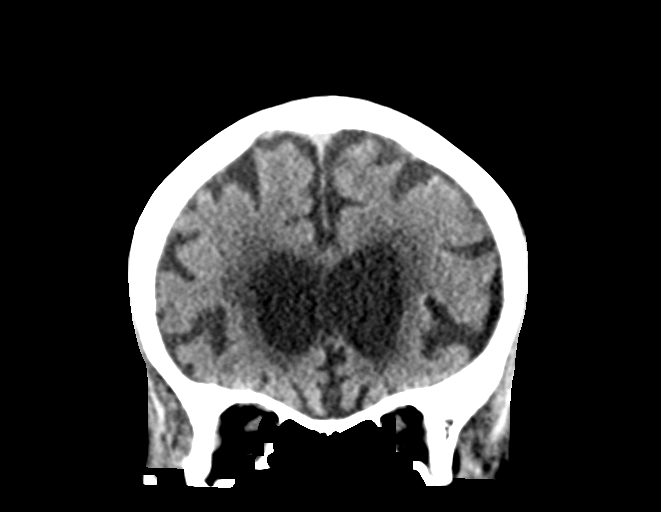
[im 28/63  brain]
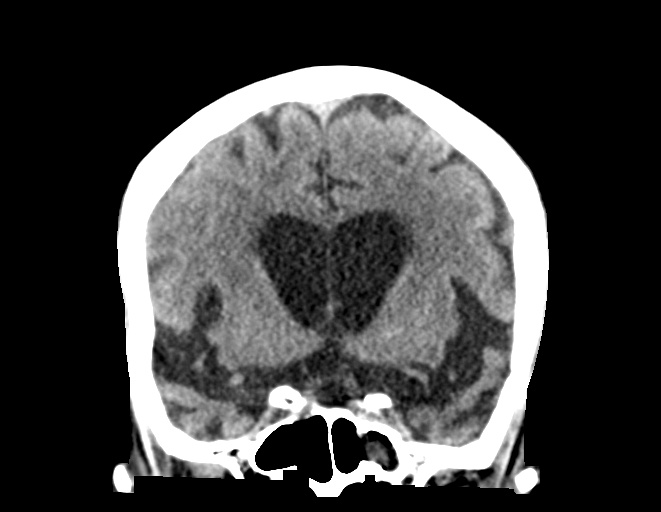
[im 35/63  brain]
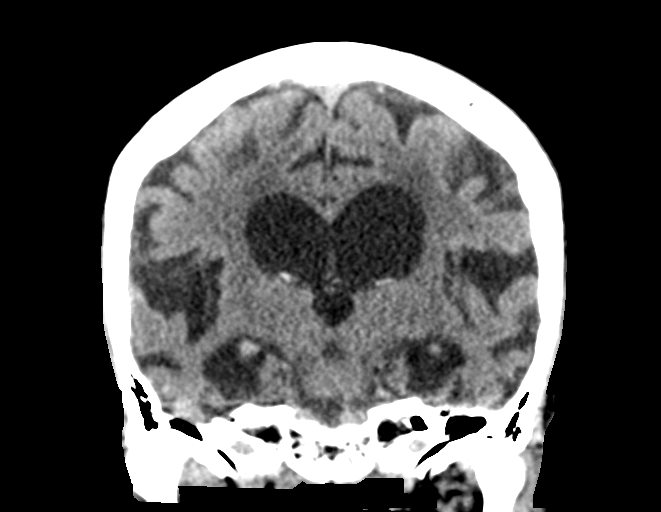

[Series 7: head without sag · sagittal · non-contrast · 0.29mm/px · 3 of 54 slices shown]
[im 18/54  brain]
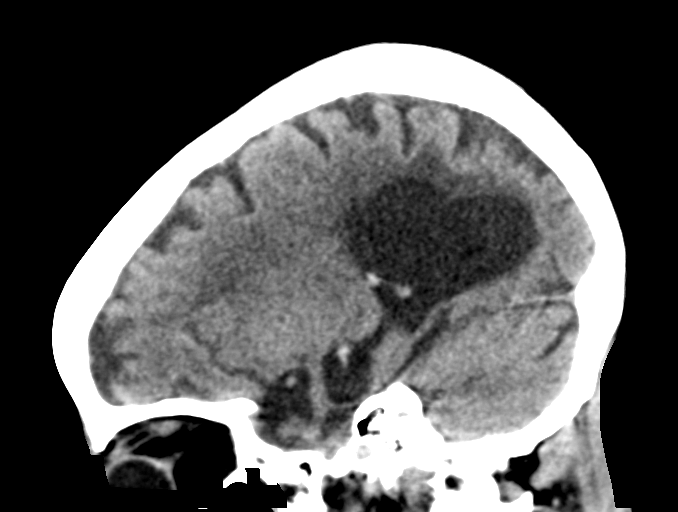
[im 27/54  brain]
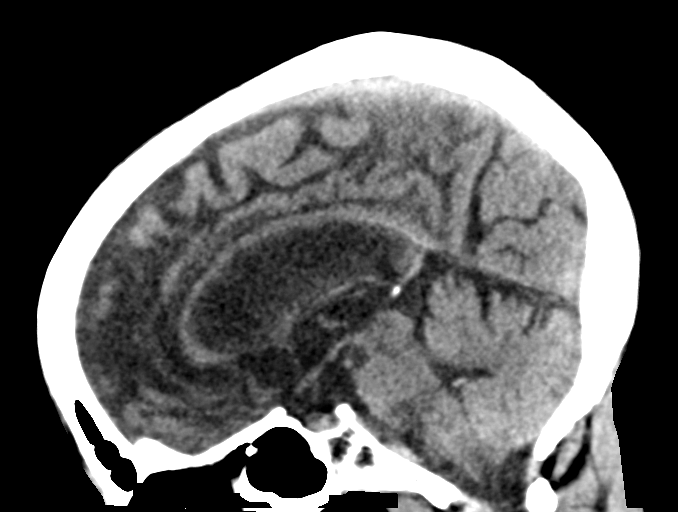
[im 36/54  brain]
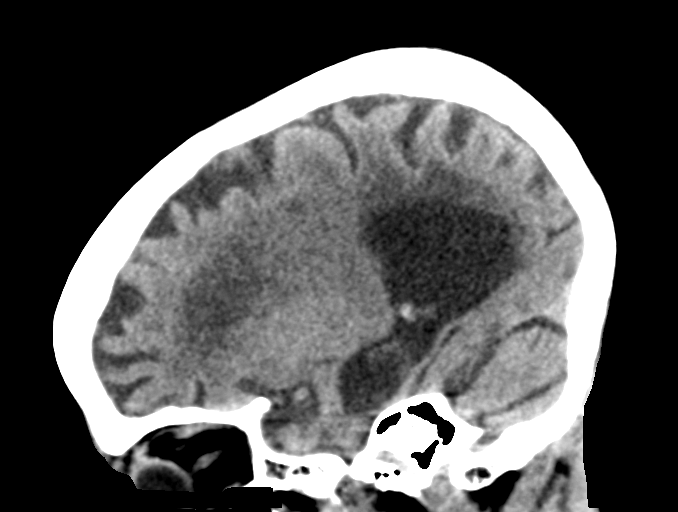

[15 of 47 positions shown; findings below may reference images not displayed]

FINDINGS: Brain: There is moderate parenchymal volume loss, stable since prior
examination. Moderate ventriculomegaly is present also unchanged
from prior examination. Moderate periventricular white matter
changes are present likely reflecting the sequela of small vessel
ischemia.

No acute intracranial hemorrhage or infarct. No abnormal mass effect
or midline shift. No abnormal intra or or extra-axial mass lesion or
fluid collection. The cerebellum is unremarkable.

Vascular: No asymmetric hyperdense vasculature at the skull base.

Skull: Intact

Sinuses/Orbits: Frothy secretions noted within the left sphenoid
sinus, similar to prior examination. Remaining paranasal sinuses are
clear. The orbits are unremarkable.

Other: Mastoid air cells and middle ear cavities are clear.
IMPRESSION: Stable moderate parenchymal atrophic changes and periventricular
white matter changes likely reflecting the sequela of small vessel
ischemia. No acute intracranial abnormality.
